# Patient Record
Sex: Female | Born: 1985 | Race: White | Hispanic: No | Marital: Married | Smoking: Former smoker
Health system: Southern US, Community
[De-identification: ages and names within clinical notes are randomized; demographics above are authoritative.]

## PROBLEM LIST (undated history)

## (undated) DIAGNOSIS — M722 Plantar fascial fibromatosis: Secondary | ICD-10-CM

## (undated) DIAGNOSIS — F909 Attention-deficit hyperactivity disorder, unspecified type: Secondary | ICD-10-CM

## (undated) DIAGNOSIS — Z87440 Personal history of urinary (tract) infections: Secondary | ICD-10-CM

## (undated) HISTORY — PX: PLANTAR FASCIA RELEASE: SHX2239

## (undated) HISTORY — DX: Personal history of urinary (tract) infections: Z87.440

## (undated) HISTORY — DX: Attention-deficit hyperactivity disorder, unspecified type: F90.9

---
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
## 2007-07-23 ENCOUNTER — Emergency Department: Payer: Self-pay | Admitting: Emergency Medicine

## 2008-02-09 ENCOUNTER — Emergency Department: Payer: Self-pay | Admitting: Emergency Medicine

## 2008-05-16 ENCOUNTER — Ambulatory Visit: Payer: Self-pay | Admitting: Podiatry

## 2008-09-09 ENCOUNTER — Emergency Department: Payer: Self-pay | Admitting: Emergency Medicine

## 2008-10-17 ENCOUNTER — Emergency Department: Payer: Self-pay | Admitting: Emergency Medicine

## 2008-11-05 ENCOUNTER — Observation Stay: Payer: Self-pay | Admitting: Obstetrics & Gynecology

## 2008-12-27 ENCOUNTER — Emergency Department: Payer: Self-pay | Admitting: Emergency Medicine

## 2008-12-27 ENCOUNTER — Observation Stay: Payer: Self-pay | Admitting: Obstetrics and Gynecology

## 2008-12-28 ENCOUNTER — Emergency Department: Payer: Self-pay

## 2009-01-28 ENCOUNTER — Observation Stay: Payer: Self-pay

## 2009-02-02 ENCOUNTER — Observation Stay: Payer: Self-pay | Admitting: Obstetrics & Gynecology

## 2009-02-18 ENCOUNTER — Inpatient Hospital Stay: Payer: Self-pay

## 2009-04-24 ENCOUNTER — Emergency Department: Payer: Self-pay | Admitting: Emergency Medicine

## 2009-04-26 ENCOUNTER — Emergency Department: Payer: Self-pay | Admitting: Internal Medicine

## 2009-04-28 ENCOUNTER — Emergency Department: Payer: Self-pay | Admitting: Unknown Physician Specialty

## 2009-05-20 ENCOUNTER — Emergency Department: Payer: Self-pay | Admitting: Emergency Medicine

## 2009-11-08 ENCOUNTER — Emergency Department: Payer: Self-pay | Admitting: Emergency Medicine

## 2010-07-19 ENCOUNTER — Emergency Department: Payer: Self-pay | Admitting: Emergency Medicine

## 2011-06-21 ENCOUNTER — Emergency Department: Payer: Self-pay | Admitting: Emergency Medicine

## 2011-06-21 LAB — URINALYSIS, COMPLETE
Glucose,UR: NEGATIVE mg/dL (ref 0–75)
Ketone: NEGATIVE
Leukocyte Esterase: NEGATIVE
Protein: NEGATIVE
Specific Gravity: 1.023 (ref 1.003–1.030)
Squamous Epithelial: 5
WBC UR: 4 /HPF (ref 0–5)

## 2011-07-24 ENCOUNTER — Emergency Department: Payer: Self-pay | Admitting: Emergency Medicine

## 2013-03-12 IMAGING — US US PELV - US TRANSVAGINAL
1 series · 17 of 25 positions shown · non-contrast
Comparison: none

REASON FOR EXAM: lower pelvic pain and  bleeding clots
COMMENTS:

PROCEDURE:     US  - US PELVIS EXAM W/TRANSVAGINAL  - July 19, 2010 [DATE]
RESULT:     Comparison: None
INDICATION: Pelvic pain. LMP 07/14/2010.
TECHNIQUE: Multiple transabdominal gray-scale images and endovaginal
gray-scale images with doppler images of the pelvis performed.

[Series 1: us pelv - us transvaginal · 17 of 68 slices shown]
[im 1/68]
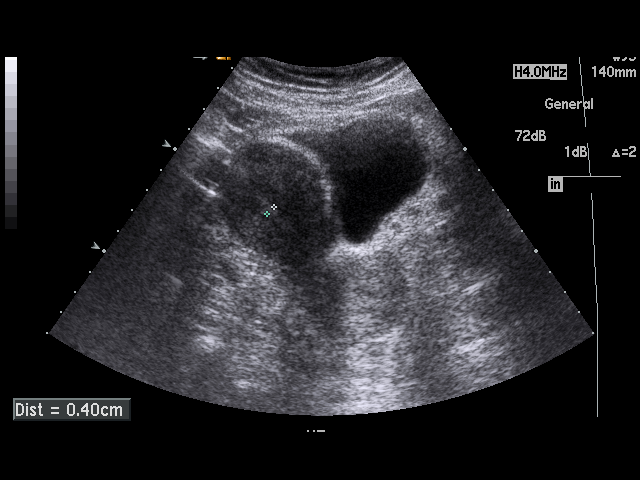
[im 6/68]
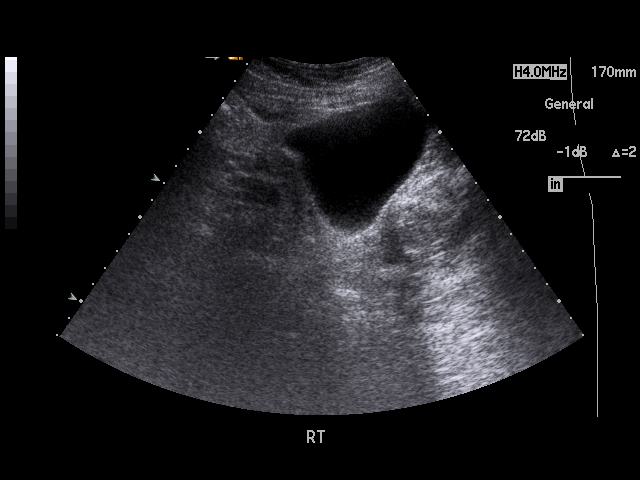
[im 9/68]
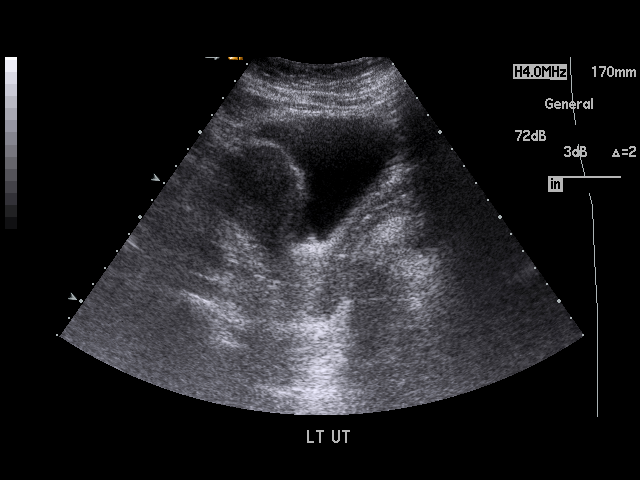
[im 14/68]
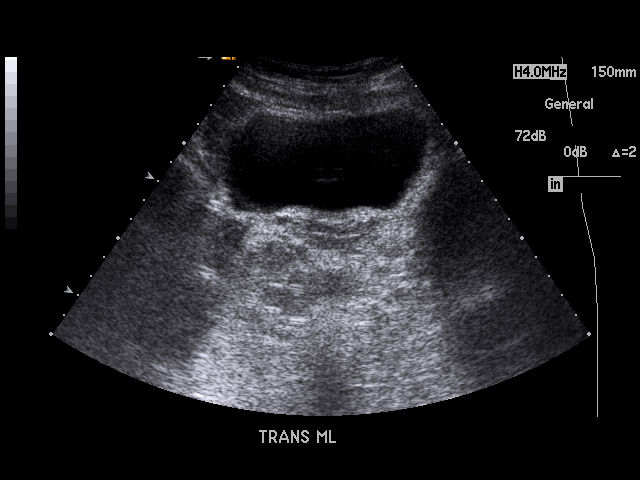
[im 17/68]
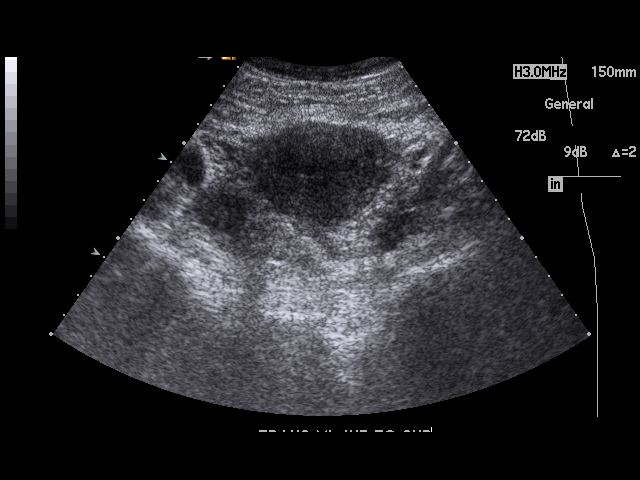
[im 23/68]
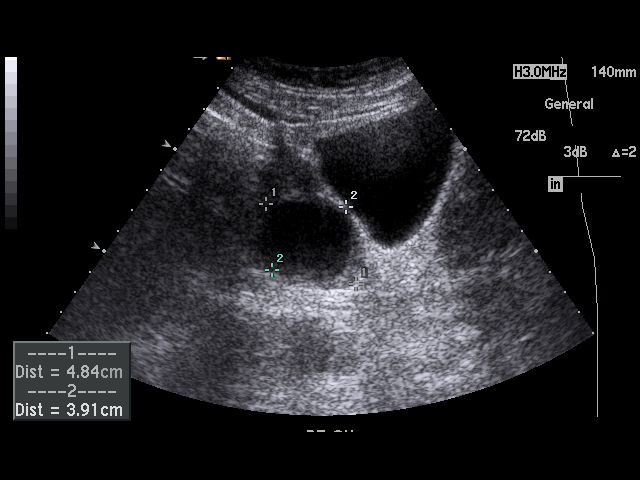
[im 26/68]
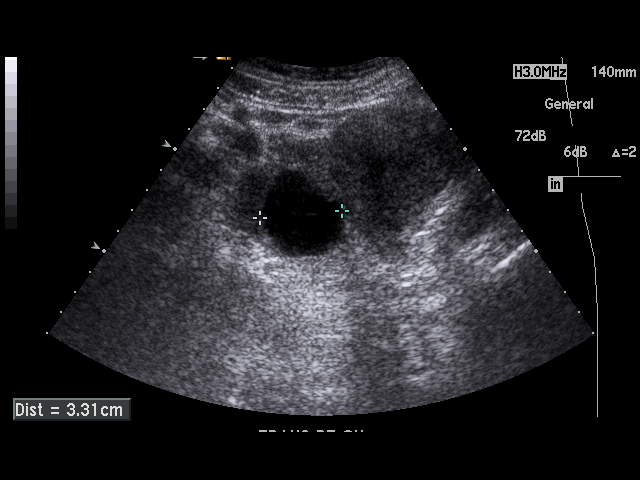
[im 31/68]
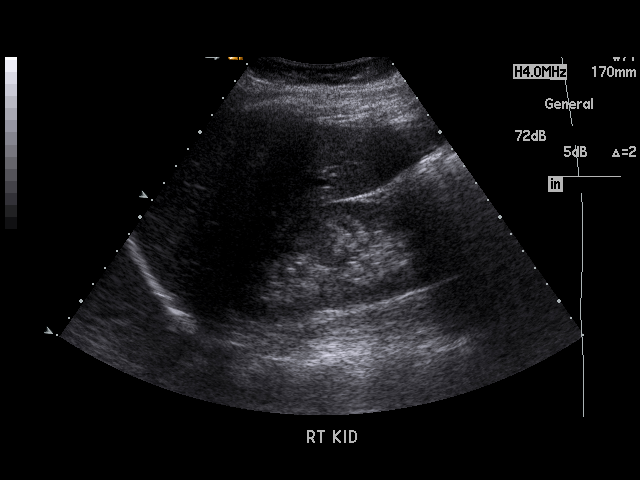
[im 34/68]
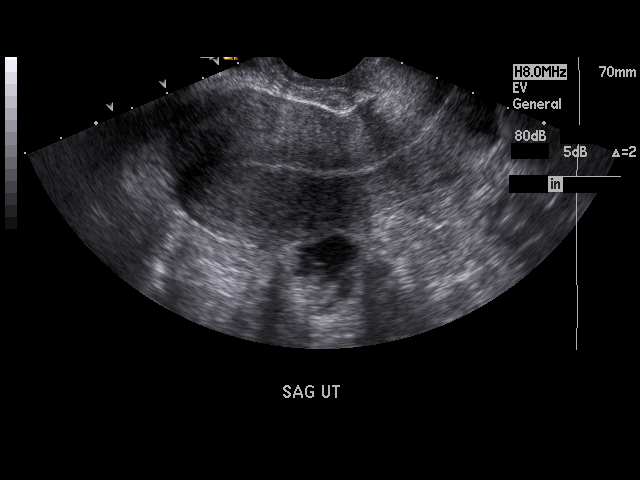
[im 37/68]
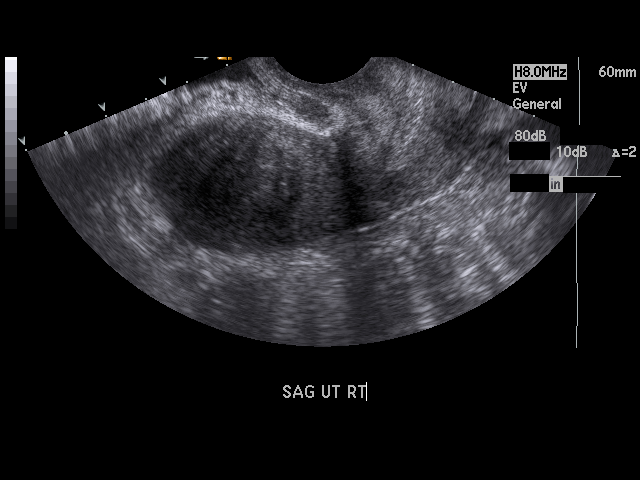
[im 42/68]
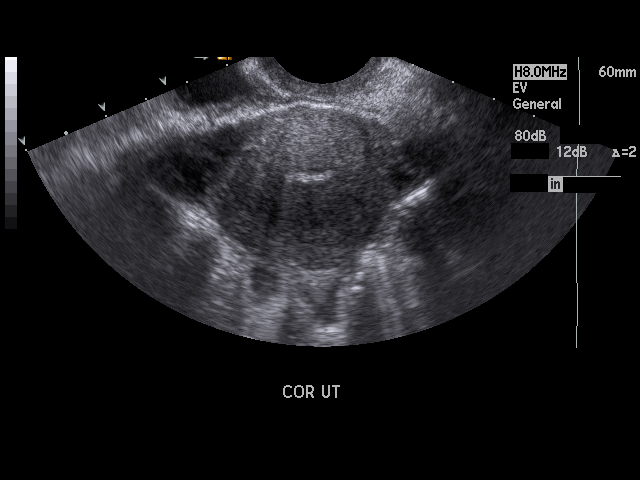
[im 45/68]
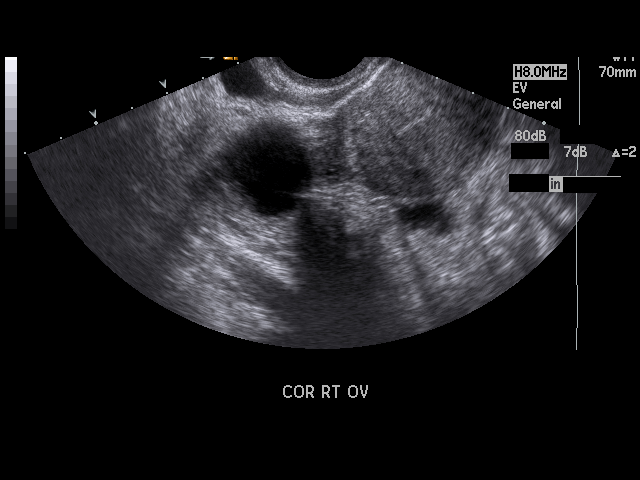
[im 51/68]
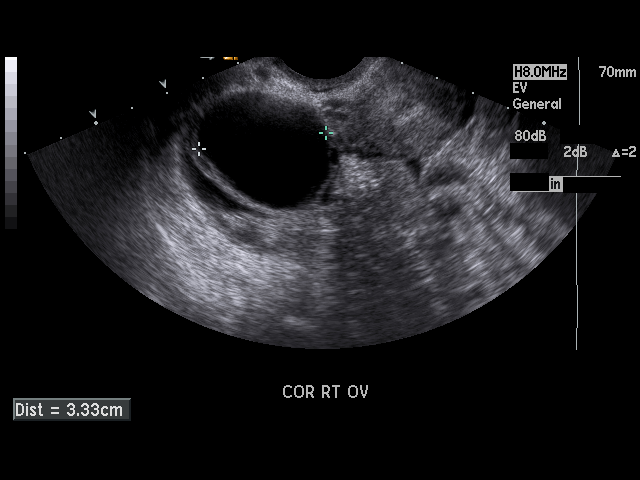
[im 54/68]
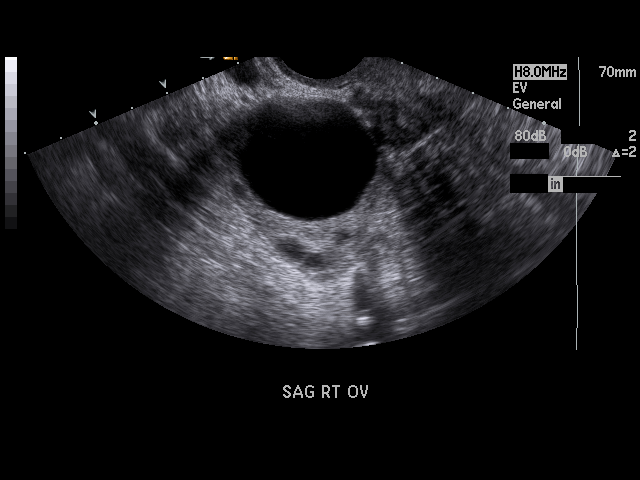
[im 59/68]
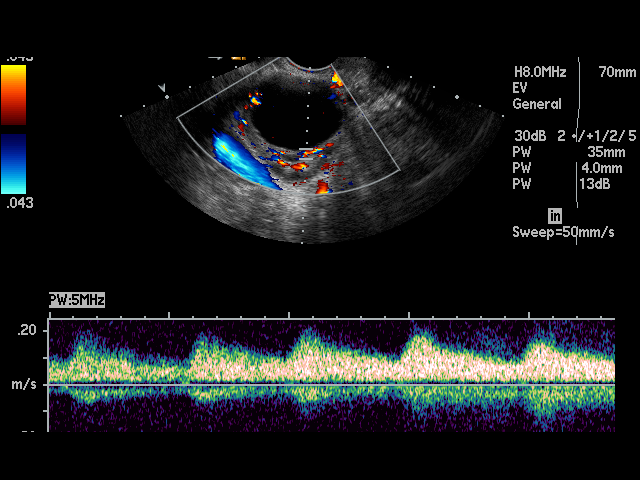
[im 62/68]
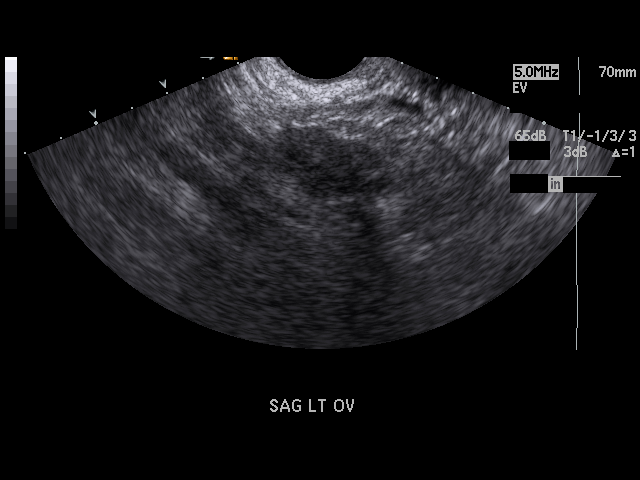
[im 68/68]
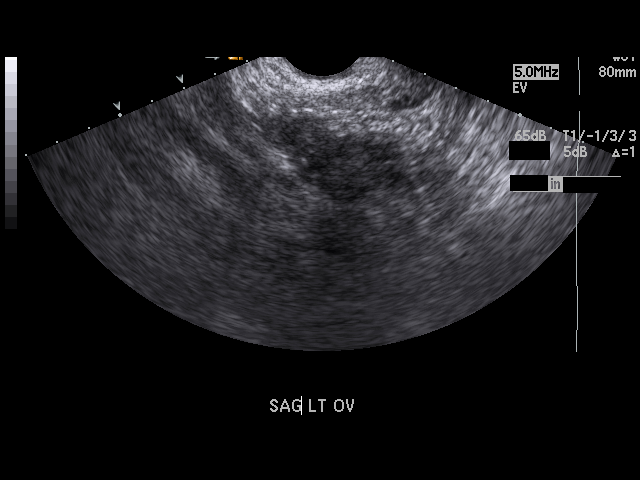

[17 of 25 positions shown; findings below may reference images not displayed]

FINDINGS: The uterus is normal in echotexture measuring 9.6 x 5.8 x 4.3 cm , with
transabdominal ultrasound. The endometrial stripe is uniform and homogeneous
measuring 2.9 mm.  There are no abnormal solid or cystic myometrial mass
lesions noted.

The right ovary measures 5 x 4.1 x 4 cm.  The left ovary measures 3.8 x
x 2.3 cm.  Normal arterial and venous Doppler waveforms are demonstrated
bilaterally.  There is a 3.7 x 3.3 x 3.2 cm anechoic right ovarian mass with
increased through transmission most consistent with a cyst.

There is no pelvic free fluid.
IMPRESSION: Right ovarian cyst. Recommend followup pelvic ultrasound in 4 to 6 weeks.

Otherwise normal pelvic ultrasound.

## 2014-11-22 ENCOUNTER — Emergency Department
Admission: EM | Admit: 2014-11-22 | Discharge: 2014-11-22 | Disposition: A | Discharge status: Home / Self Care | Payer: BLUE CROSS/BLUE SHIELD | Attending: Emergency Medicine | Admitting: Emergency Medicine

## 2014-11-22 DIAGNOSIS — Z3202 Encounter for pregnancy test, result negative: Secondary | ICD-10-CM | POA: Insufficient documentation

## 2014-11-22 DIAGNOSIS — K625 Hemorrhage of anus and rectum: Secondary | ICD-10-CM

## 2014-11-22 DIAGNOSIS — K5900 Constipation, unspecified: Secondary | ICD-10-CM | POA: Diagnosis not present

## 2014-11-22 DIAGNOSIS — Z720 Tobacco use: Secondary | ICD-10-CM | POA: Insufficient documentation

## 2014-11-22 HISTORY — DX: Plantar fascial fibromatosis: M72.2

## 2014-11-22 LAB — URINALYSIS COMPLETE WITH MICROSCOPIC (ARMC ONLY)
Bilirubin Urine: NEGATIVE
Glucose, UA: NEGATIVE mg/dL
HGB URINE DIPSTICK: NEGATIVE
Ketones, ur: NEGATIVE mg/dL
LEUKOCYTES UA: NEGATIVE
Nitrite: NEGATIVE
PROTEIN: NEGATIVE mg/dL
SPECIFIC GRAVITY, URINE: 1.021 (ref 1.005–1.030)
pH: 7 (ref 5.0–8.0)

## 2014-11-22 LAB — COMPREHENSIVE METABOLIC PANEL
ALBUMIN: 4 g/dL (ref 3.5–5.0)
ALT: 16 U/L (ref 14–54)
AST: 19 U/L (ref 15–41)
Alkaline Phosphatase: 63 U/L (ref 38–126)
Anion gap: 5 (ref 5–15)
BUN: 16 mg/dL (ref 6–20)
CHLORIDE: 107 mmol/L (ref 101–111)
CO2: 25 mmol/L (ref 22–32)
Calcium: 9.3 mg/dL (ref 8.9–10.3)
Creatinine, Ser: 0.68 mg/dL (ref 0.44–1.00)
GFR calc Af Amer: >60 mL/min (ref >60)
Glucose, Bld: 87 mg/dL (ref 65–99)
POTASSIUM: 3.8 mmol/L (ref 3.5–5.1)
SODIUM: 137 mmol/L (ref 135–145)
Total Bilirubin: 0.6 mg/dL (ref 0.3–1.2)
Total Protein: 7.1 g/dL (ref 6.5–8.1)

## 2014-11-22 LAB — POCT PREGNANCY, URINE: PREG TEST UR: NEGATIVE

## 2014-11-22 LAB — CBC
HEMATOCRIT: 39.9 % (ref 35.0–47.0)
Hemoglobin: 13.9 g/dL (ref 12.0–16.0)
MCH: 32 pg (ref 26.0–34.0)
MCHC: 34.9 g/dL (ref 32.0–36.0)
MCV: 91.9 fL (ref 80.0–100.0)
Platelets: 299 10*3/uL (ref 150–440)
RBC: 4.34 MIL/uL (ref 3.80–5.20)
RDW: 12.5 % (ref 11.5–14.5)
WBC: 9.3 10*3/uL (ref 3.6–11.0)

## 2014-11-22 LAB — LIPASE, BLOOD: LIPASE: 22 U/L (ref 11–51)

## 2014-11-22 NOTE — ED Notes (Signed)
Mid to lower abd cramping with some bright red blood in stools   Denies any n/v

## 2014-11-22 NOTE — ED Provider Notes (Signed)
[facility] Emergency Department Provider Note  ____________________________________________  Time seen: Approximately 4:30 PM  I have reviewed the triage vital signs and the nursing notes.   HISTORY  Chief Complaint Rectal Bleeding  HPI Wendy Warren is a 29 y.o. female is here with complaint of seeing bright red blood in her stool 3 days ago and then again today. Patient states that she denies having any problems with hemorrhoids in the past. She states that she did pass small hard stool 3 days ago when she first saw some bright red blood. She denies any dizziness, weakness, fever or chills. Currently she denies any abdominal pain or nausea vomiting. She denies any history of peptic ulcer disease. She does rate her discomfort as 7 out of 10.   Past Medical History  Diagnosis Date  . Plantar fasciitis     There are no active problems to display for this patient.   Past Surgical History  Procedure Laterality Date  . Plantar fascia release      No current outpatient prescriptions on file.  Allergies Morphine and related  No family history on file.  Social History Social History  Substance Use Topics  . Smoking status: Current Every Day Smoker  . Smokeless tobacco: None  . Alcohol Use: No    Review of Systems Constitutional: No fever/chills Eyes: No visual changes. Cardiovascular: Denies chest pain. Respiratory: Denies shortness of breath. Gastrointestinal: Mild abdominal pain.  No nausea, no vomiting.  No diarrhea.  Positive constipation. Musculoskeletal: Negative for back pain. Skin: Negative for rash. Neurological: Negative for headaches, focal weakness or numbness.  10-point ROS otherwise negative.  ____________________________________________   PHYSICAL EXAM:  VITAL SIGNS: ED Triage Vitals  Enc Vitals Group     BP 11/22/14 1223 130/89 mmHg     Pulse Rate 11/22/14 1223 71     Resp 11/22/14 1223 16     Temp 11/22/14 1223  97.7 F (36.5 C)     Temp src --      SpO2 11/22/14 1223 100 %     Weight 11/22/14 1223 165 lb (74.844 kg)     Height 11/22/14 1223  (1.575 m)     Head Cir --      Peak Flow --      Pain Score 11/22/14 1223 7     Pain Loc --      Pain Edu? --      Excl. in GC? --     Constitutional: Alert and oriented. Well appearing and in no acute distress. Eyes: Conjunctivae are normal. PERRL. EOMI. Head: Atraumatic. Nose: No congestion/rhinnorhea. Neck: No stridor.   Cardiovascular: Normal rate, regular rhythm. Grossly normal heart sounds.  Good peripheral circulation. Respiratory: Normal respiratory effort.  No retractions. Lungs CTAB. Gastrointestinal: Soft and nontender. No distention. Bowel sounds 4 quadrants normal activity Rectal exam did not show any external hemorrhoids. No blood was seen. Digital exam no masses were noted. No stool was present in the vault. Hemoccult slide was positive Musculoskeletal: No lower extremity tenderness nor edema.  No joint effusions. Neurologic:  Normal speech and language. No gross focal neurologic deficits are appreciated. No gait instability. Skin:  Skin is warm, dry and intact. No rash noted. Psychiatric: Mood and affect are normal. Speech and behavior are normal.  ____________________________________________   LABS (all labs ordered are listed, but only abnormal results are displayed)  Labs Reviewed  URINALYSIS COMPLETEWITH MICROSCOPIC ([facility] ONLY) - Abnormal; Notable for the following:  Color, Urine YELLOW (*)    APPearance TURBID (*)    Bacteria, UA RARE (*)    Squamous Epithelial / LPF 6-30 (*)    All other components within normal limits  LIPASE, BLOOD  COMPREHENSIVE METABOLIC PANEL  CBC  POC URINE PREG, ED  POCT PREGNANCY, URINE    PROCEDURES  Procedure(s) performed: None  Critical Care performed: No  ____________________________________________   INITIAL IMPRESSION / ASSESSMENT AND PLAN / ED COURSE  Pertinent labs  & imaging results that were available during my care of the patient were reviewed by me and considered in my medical decision making (see chart for details).  Patient's lab work was reviewed and is stable. Patient is not actively bleeding at this time and history reports that this is been intermittent. The patient was encouraged to make an appointment with Dr. Bluford Kaufmannh for further evaluation. She is return to the emergency room if any severe worsening of her symptoms or tarry stools. ____________________________________________   FINAL CLINICAL IMPRESSION(S) / ED DIAGNOSES  Final diagnoses:  Rectal bleeding      Tommi RumpsRhonda L Alen Matheson, PA-C 11/22/14 1703  Emily FilbertJonathan E Williams, MD 11/22/14 2251

## 2014-11-22 NOTE — ED Notes (Signed)
Pt c/o bright red blood with BM intermittent for the past 3 days, only passing small hard stools.Marland Kitchen.denies rectal pain,, pt c/o mid abd cramping and nausea..Marland Kitchen

## 2014-11-22 NOTE — Discharge Instructions (Signed)
YOU WILL NEED TO MAKE AN APPOINTMENT WITH DR. Bluford KaufmannH FOR FURTHER EVALUATION If there are any problems with hard stools or constipation began taking Colace for stool softening. Increase fluid intake. Currently all of your lab work is within normal limits. Return to the emergency room if any tarry black stools are seen.

## 2017-03-21 ENCOUNTER — Other Ambulatory Visit: Payer: Self-pay

## 2017-03-21 ENCOUNTER — Encounter: Payer: Self-pay | Admitting: Emergency Medicine

## 2017-03-21 ENCOUNTER — Emergency Department
Admission: EM | Admit: 2017-03-21 | Discharge: 2017-03-21 | Disposition: A | Discharge status: Home / Self Care | Payer: BLUE CROSS/BLUE SHIELD | Attending: Emergency Medicine | Admitting: Emergency Medicine

## 2017-03-21 DIAGNOSIS — J101 Influenza due to other identified influenza virus with other respiratory manifestations: Secondary | ICD-10-CM | POA: Insufficient documentation

## 2017-03-21 DIAGNOSIS — F17210 Nicotine dependence, cigarettes, uncomplicated: Secondary | ICD-10-CM | POA: Insufficient documentation

## 2017-03-21 LAB — INFLUENZA PANEL BY PCR (TYPE A & B)
INFLAPCR: POSITIVE — AB
Influenza B By PCR: NEGATIVE

## 2017-03-21 MED ORDER — OSELTAMIVIR PHOSPHATE 75 MG PO CAPS: 75.0000 mg | ORAL_CAPSULE | Freq: Two times a day (BID) | ORAL | 0 refills | Status: DC

## 2017-03-21 MED ORDER — ONDANSETRON 4 MG PO TBDP
4.0000 mg | ORAL_TABLET | Freq: Three times a day (TID) | ORAL | 0 refills | Status: DC | PRN
Start: 2017-03-21 — End: 2018-09-07

## 2017-03-21 NOTE — ED Triage Notes (Signed)
Cough and body aches since yesterday.

## 2017-03-21 NOTE — ED Provider Notes (Signed)
[facility] Emergency Department Provider Note  ____________________________________________  Time seen: Approximately 4:38 PM  I have reviewed the triage vital signs and the nursing notes.   HISTORY  Chief Complaint Cough and Generalized Body Aches    HPI Wendy Warren is a 32 y.o. female who presents emergency department complaining of fevers and chills, nasal congestion, sore throat, cough, body aches, nausea times 2 days.  Symptoms began rather rapidly.  Patient reports that she does not have an appetite.  She has any headache, visual changes, neck pain or stiffness, chest pain, shortness of breath, emesis, diarrhea or constipation.  Patient has been taking Tylenol, Motrin, DayQuil, NyQuil for symptoms.  This does not improve her symptoms greatly.  Patient is staying hydrated with fluids at home.  No other complaints.  No other medications prior to arrival.  Past Medical History:  Diagnosis Date  . Plantar fasciitis     There are no active problems to display for this patient.   Past Surgical History:  Procedure Laterality Date  . PLANTAR FASCIA RELEASE      Prior to Admission medications   Medication Sig Start Date End Date Taking? Authorizing Provider  ondansetron (ZOFRAN-ODT) 4 MG disintegrating tablet Take 1 tablet (4 mg total) by mouth every 8 (eight) hours as needed for nausea or vomiting. 03/21/17   Nehemyah Foushee, Delorise RoyalsJonathan D, PA-C  oseltamivir (TAMIFLU) 75 MG capsule Take 1 capsule (75 mg total) by mouth 2 (two) times daily. 03/21/17   Electa Sterry, Delorise RoyalsJonathan D, PA-C    Allergies Morphine and related  No family history on file.  Social History Social History   Tobacco Use  . Smoking status: Current Every Day Smoker    Packs/day: 0.50    Types: Cigarettes  Substance Use Topics  . Alcohol use: No  . Drug use: No     Review of Systems  Constitutional: Positive fever/chills.  Positive for generalized body aches Eyes: No visual changes. No  discharge ENT: Other for nasal congestion and sore throat. Cardiovascular: no chest pain. Respiratory: Positive cough. No SOB. Gastrointestinal: No abdominal pain.  Positive nausea, no vomiting.  No diarrhea.  No constipation. Musculoskeletal: Negative for musculoskeletal pain. Skin: Negative for rash, abrasions, lacerations, ecchymosis. Neurological: Negative for headaches, focal weakness or numbness. 10-point ROS otherwise negative.  ____________________________________________   PHYSICAL EXAM:  VITAL SIGNS: ED Triage Vitals  Enc Vitals Group     BP 03/21/17 1414 124/68     Pulse Rate 03/21/17 1414 89     Resp 03/21/17 1414 20     Temp 03/21/17 1414 99.3 F (37.4 C)     Temp Source 03/21/17 1414 Oral     SpO2 03/21/17 1414 97 %     Weight 03/21/17 1415 150 lb (68 kg)     Height 03/21/17 1415 5\' 2"  (1.575 m)     Head Circumference --      Peak Flow --      Pain Score 03/21/17 1414 8     Pain Loc --      Pain Edu? --      Excl. in GC? --      Constitutional: Alert and oriented. Well appearing and in no acute distress. Eyes: Conjunctivae are normal. PERRL. EOMI. Head: Atraumatic. ENT:      Ears: EACs and TMs unremarkable bilaterally.      Nose: Moderate clear congestion/rhinnorhea.      Mouth/Throat: Mucous membranes are moist.  Oropharynx is mildly erythematous but nonedematous.  Tonsils  are unremarkable.  Uvula is midline. Neck: No stridor.  Neck is supple full range of motion Hematological/Lymphatic/Immunilogical: Diffuse, mobile, nontender anterior cervical lymphadenopathy. Cardiovascular: Normal rate, regular rhythm. Normal S1 and S2.  Good peripheral circulation. Respiratory: Normal respiratory effort without tachypnea or retractions. Lungs CTAB. Good air entry to the bases with no decreased or absent breath sounds. Gastrointestinal: Bowel sounds 4 quadrants. Soft and nontender to palpation. No guarding or rigidity. No palpable masses. No distention. No CVA  tenderness. Musculoskeletal: Full range of motion to all extremities. No gross deformities appreciated. Neurologic:  Normal speech and language. No gross focal neurologic deficits are appreciated.  Skin:  Skin is warm, dry and intact. No rash noted. Psychiatric: Mood and affect are normal. Speech and behavior are normal. Patient exhibits appropriate insight and judgement.   ____________________________________________   LABS (all labs ordered are listed, but only abnormal results are displayed)  Labs Reviewed  INFLUENZA PANEL BY PCR (TYPE A & B) - Abnormal; Notable for the following components:      Result Value   Influenza A By PCR POSITIVE (*)    All other components within normal limits   ____________________________________________  EKG   ____________________________________________  RADIOLOGY   No results found.  ____________________________________________    PROCEDURES  Procedure(s) performed:    Procedures    Medications - No data to display   ____________________________________________   INITIAL IMPRESSION / ASSESSMENT AND PLAN / ED COURSE  Pertinent labs & imaging results that were available during my care of the patient were reviewed by me and considered in my medical decision making (see chart for details).  Review of the [place] CSRS was performed in accordance of the [facility] prior to dispensing any controlled drugs.     Patient's diagnosis is consistent with influenza A.  Patient presented with flulike symptoms.  Differential included viral URI, strep, sinusitis, gastroenteritis, influenza.  Patient was positive on testing for influenza A.. Patient will be discharged home with prescriptions for Tamiflu and Zofran. Patient is to follow up with primary care as needed or otherwise directed. Patient is given ED precautions to return to the ED for any worsening or new symptoms.     ____________________________________________  FINAL CLINICAL  IMPRESSION(S) / ED DIAGNOSES  Final diagnoses:  Influenza A      NEW MEDICATIONS STARTED DURING THIS VISIT:  ED Discharge Orders        Ordered    oseltamivir (TAMIFLU) 75 MG capsule  2 times daily     03/21/17 1638    ondansetron (ZOFRAN-ODT) 4 MG disintegrating tablet  Every 8 hours PRN     03/21/17 1638          This chart was dictated using voice recognition software/Dragon. Despite best efforts to proofread, errors can occur which can change the meaning. Any change was purely unintentional.    Racheal Patches, PA-C 03/21/17 1645    Pershing Proud Myra Rude, MD 03/21/17 [phone]

## 2017-03-21 NOTE — ED Notes (Signed)
Pt reports being achy.  Pt states "I hate the fucking hospital, no one does anything for you here".  Pt was asleep in waiting room and was wheeled back to room by friend.  Pt repetitively cursing at this RN and friend with her.

## 2018-08-21 NOTE — Progress Notes (Signed)
Chart abstraction per phone interview with Tawanna Solo, RN 08/21/18 Debera Lat, RN

## 2018-08-22 ENCOUNTER — Other Ambulatory Visit: Payer: Self-pay

## 2018-08-22 ENCOUNTER — Ambulatory Visit: Payer: Medicaid Other | Admitting: Nurse Practitioner

## 2018-08-22 ENCOUNTER — Other Ambulatory Visit: Payer: Self-pay | Admitting: Family Medicine

## 2018-08-22 ENCOUNTER — Encounter: Payer: Self-pay | Admitting: Nurse Practitioner

## 2018-08-22 DIAGNOSIS — F909 Attention-deficit hyperactivity disorder, unspecified type: Secondary | ICD-10-CM | POA: Diagnosis not present

## 2018-08-22 DIAGNOSIS — Z3480 Encounter for supervision of other normal pregnancy, unspecified trimester: Secondary | ICD-10-CM

## 2018-08-22 LAB — URINALYSIS
Bilirubin, UA: NEGATIVE
Glucose, UA: NEGATIVE
Ketones, UA: NEGATIVE
Leukocytes,UA: NEGATIVE
Nitrite, UA: NEGATIVE
Protein,UA: NEGATIVE
RBC, UA: NEGATIVE
Specific Gravity, UA: 1.03 (ref 1.005–1.030)
Urobilinogen, Ur: 0.2 mg/dL (ref 0.2–1.0)
pH, UA: 7.5 (ref 5.0–7.5)

## 2018-08-22 LAB — WET PREP FOR TRICH, YEAST, CLUE
Trichomonas Exam: NEGATIVE
Yeast Exam: NEGATIVE

## 2018-08-22 LAB — HEMOGLOBIN, FINGERSTICK: Hemoglobin: 13.2 g/dL (ref 11.1–15.9)

## 2018-08-22 LAB — OB RESULTS CONSOLE HIV ANTIBODY (ROUTINE TESTING): HIV: NONREACTIVE

## 2018-08-22 NOTE — Progress Notes (Signed)
[place] DEPT [facility] [place] [id number] [phone]  INITIAL PRENATAL VISIT NOTE  Subjective:  Wendy Warren is a 33 y.o. G3P2002 at [redacted]w[redacted]d being seen today to start prenatal care at the [facility].  She is currently monitored for the following issues for this high-risk pregnancy and has ADHD and Supervision of other normal pregnancy, antepartum on their problem list.  Patient reports no complaints.  Contractions: Not present. Vag. Bleeding: None.  Movement: Absent. Denies leaking of fluid.   The following portions of the patient's history were reviewed and updated as appropriate: allergies, current medications, past family history, past medical history, past social history, past surgical history and problem list. Problem list updated.  Objective:   Vitals:   08/22/18 0837  BP: 117/72  Temp: 98 F (36.7 C)  Weight: 168 lb 3.2 oz (76.3 kg)    Fetal Status:     Movement: Absent     Physical Exam Vitals signs and nursing note reviewed.  Constitutional:      General: She is not in acute distress.    Appearance: Normal appearance. She is well-developed.  HENT:     Head: Normocephalic and atraumatic.     Mouth/Throat:     Lips: Pink.     Mouth: Mucous membranes are moist.     Dentition: Normal dentition. No dental caries.  Neck:     Thyroid: No thyroid mass or thyromegaly.  Cardiovascular:     Rate and Rhythm: Normal rate.  Pulmonary:     Effort: Pulmonary effort is normal.     Breath sounds: Normal breath sounds.  Chest:     Breasts: Breasts are symmetrical.        Right: Normal. No mass, nipple discharge or skin change.        Left: Normal. No mass, nipple discharge or skin change.  Abdominal:     General: Abdomen is flat.     Palpations: Abdomen is soft.     Tenderness: There is no abdominal tenderness.     Comments: Gravid   Genitourinary:    General: Normal  vulva.     Exam position: Lithotomy position.     Pubic Area: No rash.      Labia:        Right: No rash.        Left: No rash.      Vagina: Normal. No vaginal discharge.     Cervix: No cervical motion tenderness or friability.     Uterus: Normal. Enlarged (G 3 P 2 -  ~ [redacted] wks gestation size - below SP). Not tender.      Adnexa: Right adnexa normal and left adnexa normal.     Rectum: Normal. No external hemorrhoid.  Lymphadenopathy:     Upper Body:     Right upper body: No axillary adenopathy.     Left upper body: No axillary adenopathy.  Skin:    General: Skin is warm.     Capillary Refill: Capillary refill takes less than 2 seconds.  Neurological:     Mental Status: She is alert.     Assessment and Plan:  Pregnancy: G3P2002 at [redacted]w[redacted]d  1. Attention deficit hyperactivity disorder (ADHD), unspecified ADHD type Discussed Adderall medication  - plan to consult with Dr. Ernestina Patches concerning continuation of medication during pregnancy  2. Supervision of other normal pregnancy, antepartum Doing well      Discussed overview of care  and coordination with [facility].   Reviewed Centering pregnancy as standard of care at [facility], oriented to room and showed video. Based on EDD, plan for Cycle  - discussed Zoom pregnancy.    Preterm labor symptoms and general obstetric precautions including but not limited to vaginal bleeding, contractions, leaking of fluid and fetal movement were reviewed in detail with the patient.  Please refer to After Visit Summary for other counseling recommendations.   Return in about 4 weeks (around 09/19/2018) for telehealth.  Future Appointments  Date Time Provider Department Center  09/19/2018  8:20 AM [facility] PROVIDER [facility] None    Donn PieriniKarla W Kaulana Brindle, NP

## 2018-08-22 NOTE — Patient Instructions (Signed)

## 2018-08-22 NOTE — Progress Notes (Signed)
Patient in for new OB appt. Patient husband accompanying. Early 1 hr. Gtt today with other labs.Jenetta Downer, RN

## 2018-08-23 ENCOUNTER — Other Ambulatory Visit: Payer: Self-pay | Admitting: Nurse Practitioner

## 2018-08-23 DIAGNOSIS — Z369 Encounter for antenatal screening, unspecified: Secondary | ICD-10-CM

## 2018-08-24 ENCOUNTER — Telehealth: Payer: Self-pay

## 2018-08-24 ENCOUNTER — Ambulatory Visit: Payer: Self-pay

## 2018-08-24 LAB — CBC/D/PLT+RPR+RH+ABO+RUB AB...
Antibody Screen: NEGATIVE
Basophils Absolute: 0 10*3/uL (ref 0.0–0.2)
Basos: 0 %
EOS (ABSOLUTE): 0.2 10*3/uL (ref 0.0–0.4)
Eos: 2 %
Hematocrit: 39.5 % (ref 34.0–46.6)
Hemoglobin: 13.6 g/dL (ref 11.1–15.9)
Hepatitis B Surface Ag: NEGATIVE
Immature Grans (Abs): 0 10*3/uL (ref 0.0–0.1)
Immature Granulocytes: 0 %
Lymphocytes Absolute: 1.8 10*3/uL (ref 0.7–3.1)
Lymphs: 22 %
MCH: 32.4 pg (ref 26.6–33.0)
MCHC: 34.4 g/dL (ref 31.5–35.7)
MCV: 94 fL (ref 79–97)
Monocytes Absolute: 0.5 10*3/uL (ref 0.1–0.9)
Monocytes: 6 %
Neutrophils Absolute: 5.9 10*3/uL (ref 1.4–7.0)
Neutrophils: 70 %
Platelets: 268 10*3/uL (ref 150–450)
RBC: 4.2 x10E6/uL (ref 3.77–5.28)
RDW: 12 % (ref 11.7–15.4)
RPR Ser Ql: NONREACTIVE
Rh Factor: POSITIVE
Rubella Antibodies, IGG: 1.68 index (ref >0.99)
Varicella zoster IgG: 514 index (ref >165)
WBC: 8.5 10*3/uL (ref 3.4–10.8)

## 2018-08-24 LAB — GLUCOSE, 1 HOUR GESTATIONAL: Gestational Diabetes Screen: 76 mg/dL (ref 65–139)

## 2018-08-24 LAB — COMPREHENSIVE METABOLIC PANEL
ALT: 9 IU/L (ref 0–32)
AST: 15 IU/L (ref 0–40)
Albumin/Globulin Ratio: 1.6 (ref 1.2–2.2)
Albumin: 3.9 g/dL (ref 3.8–4.8)
Alkaline Phosphatase: 59 IU/L (ref 39–117)
BUN/Creatinine Ratio: 13 (ref 9–23)
BUN: 7 mg/dL (ref 6–20)
Bilirubin Total: <0.2 mg/dL (ref 0.0–1.2)
CO2: 23 mmol/L (ref 20–29)
Calcium: 9.1 mg/dL (ref 8.7–10.2)
Chloride: 102 mmol/L (ref 96–106)
Creatinine, Ser: 0.56 mg/dL — ABNORMAL LOW (ref 0.57–1.00)
GFR calc Af Amer: 144 mL/min/{1.73_m2} (ref >59)
GFR calc non Af Amer: 125 mL/min/{1.73_m2} (ref >59)
Globulin, Total: 2.4 g/dL (ref 1.5–4.5)
Glucose: 83 mg/dL (ref 65–99)
Potassium: 4.3 mmol/L (ref 3.5–5.2)
Sodium: 139 mmol/L (ref 134–144)
Total Protein: 6.3 g/dL (ref 6.0–8.5)

## 2018-08-24 LAB — HGB A1C W/O EAG: Hgb A1c MFr Bld: 4.6 % — ABNORMAL LOW (ref 4.8–5.6)

## 2018-08-24 LAB — TSH: TSH: 1.32 u[IU]/mL (ref 0.450–4.500)

## 2018-08-24 LAB — URINE CULTURE

## 2018-08-24 LAB — LEAD, BLOOD (ADULT >= 16 YRS): Lead-Whole Blood: NOT DETECTED ug/dL (ref 0–4)

## 2018-08-24 NOTE — Telephone Encounter (Signed)
Phone call to client who states has spoken with [facility] and will only have genetic counseling appt via phone call on Monday 08/28/2018 at 1 pm. 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 Client states [facility] will schedule [place] following phone appt Monday if [place] needed. Client reminded that after EGA of 13 6/7, first trimester [place] unable to be performed. Shona Needles, RN

## 2018-08-24 NOTE — Telephone Encounter (Signed)
Attempted to call client to inform of [facility] 1st screen appt: 08/28/18 @ 12:45; no answer & no voicemail Debera Lat, RN

## 2018-08-27 LAB — 789231 7+OXYCODONE-BUND
Amphetamines, Urine: NEGATIVE ng/mL
BENZODIAZ UR QL: NEGATIVE ng/mL
Barbiturate screen, urine: NEGATIVE ng/mL
Cocaine (Metab.): NEGATIVE ng/mL
OPIATE SCREEN URINE: NEGATIVE ng/mL
Oxycodone/Oxymorphone, Urine: NEGATIVE ng/mL
PCP Quant, Ur: NEGATIVE ng/mL

## 2018-08-27 LAB — PROTEIN / CREATININE RATIO, URINE
Creatinine, Urine: 70.3 mg/dL
Protein, Ur: 4.1 mg/dL
Protein/Creat Ratio: 58 mg/g creat (ref 0–200)

## 2018-08-27 LAB — CANNABINOID CONFIRMATION, UR
CANNABINOIDS: POSITIVE — AB
Carboxy THC GC/MS Conf: 166 ng/mL

## 2018-08-28 ENCOUNTER — Ambulatory Visit: Payer: Self-pay

## 2018-08-28 LAB — CHLAMYDIA/GC NAA, CONFIRMATION
Chlamydia trachomatis, NAA: NEGATIVE
Neisseria gonorrhoeae, NAA: NEGATIVE

## 2018-08-29 ENCOUNTER — Encounter: Payer: Self-pay | Admitting: Nurse Practitioner

## 2018-08-29 DIAGNOSIS — Z6710 Type A blood, Rh positive: Secondary | ICD-10-CM | POA: Insufficient documentation

## 2018-08-29 DIAGNOSIS — F1290 Cannabis use, unspecified, uncomplicated: Secondary | ICD-10-CM | POA: Insufficient documentation

## 2018-08-30 LAB — IGP, APTIMA HPV
HPV Aptima: NEGATIVE
PAP Smear Comment: 0

## 2018-08-31 ENCOUNTER — Other Ambulatory Visit: Payer: Self-pay

## 2018-08-31 ENCOUNTER — Ambulatory Visit
Admission: RE | Admit: 2018-08-31 | Discharge: 2018-08-31 | Disposition: A | Discharge status: Home / Self Care | Payer: BLUE CROSS/BLUE SHIELD | Source: Ambulatory Visit | Attending: Obstetrics and Gynecology | Admitting: Obstetrics and Gynecology

## 2018-08-31 DIAGNOSIS — Z818 Family history of other mental and behavioral disorders: Secondary | ICD-10-CM

## 2018-08-31 DIAGNOSIS — Z36 Encounter for antenatal screening for chromosomal anomalies: Secondary | ICD-10-CM

## 2018-08-31 NOTE — Progress Notes (Addendum)
Virtual Visit via Telephone Note  I connected with Wendy Warren on 08/31/2018 at  1:00 PM EDT by telephone and verified that I am speaking with the correct person using two identifiers.  Ms. Wendy Warren was referred to [facility] for genetic counseling to review prenatal screening and testing options.  This note summarizes the information we discussed.    We offered the following routine screening tests for this pregnancy:  The most accurate screening option for chromosome conditions is cell free fetal DNA testing.  Though this is typically reserved for pregnancies at increased risk for aneuploidy, it is currently being made available and many insurance companies are adding coverage for this testing in low risk patients during COVID.  This test utilizes a maternal blood sample and DNA sequencing technology to isolate circulating cell free fetal DNA from maternal plasma.  The fetal DNA can then be analyzed for DNA sequences that are derived from the three most common chromosomes involved in aneuploidy, chromosomes 13, 18, and 21.  If the overall amount of DNA is greater than the expected level for any of these chromosomes, aneuploidy is suspected.  The detection rates are >99% for Down syndrome, >98% for trisomy 18 and >91% for Trisomy 13.  While we do not consider it a replacement for invasive testing and karyotype analysis, a negative result from this testing would be reassuring, though not a guarantee of a normal chromosome complement for the baby.  An abnormal result may be suggestive of an abnormal chromosome complement, though we would still recommend CVS or amniocentesis to confirm any findings from this testing.  First trimester screening, which includes nuchal translucency ultrasound screen and first trimester maternal serum marker screening, is the test that has most recently been available for low risk patients.  The nuchal translucency has approximately an 80% detection  rate for Down syndrome and can be positive for other chromosome abnormalities as well as congenital heart defects.  When combined with a maternal serum marker screening, the detection rate is up to 90% for Down syndrome and up to 97% for trisomy 18.   Given current recommendations during COVID, we are offering only the biochemical testing portion of this testing (without the ultrasound and NT portion), which has a much lower detection rate.  Maternal serum marker screening, or "quad" screen, is a blood test that measures pregnancy proteins, can provide risk assessments for Down syndrome, trisomy 18, and open neural tube defects (spina bifida, anencephaly). Because it does not directly examine the fetus, it cannot positively diagnose or rule out these problems. This is a second trimester option which could be offered along with the anatomy ultrasound. It can detect approximately 75% of babies with Down syndrome, 80% of babies with open spina bifida and 70% of babies with trisomy 2318.  Targeted ultrasound uses high frequency sound waves to create an image of the developing fetus.  An ultrasound is often recommended as a routine means of evaluating the pregnancy.  It is also used to screen for fetal anatomy problems (for example, a heart defect) that might be suggestive of a chromosomal or other abnormality. We are currently not recommending a first trimester ultrasound other than that which would be ordered for dating and viability.  Should these screening tests indicate an increased concern, then the following additional testing options would be offered:  The chorionic villus sampling procedure is available for first trimester chromosome analysis.  This involves the withdrawal of a small amount of chorionic  villi (tissue from the developing placenta).  Risk of pregnancy loss is estimated to be approximately 1 in 200 to 1 in 100 (0.5 to 1%).  There is approximately a 1% (1 in 100) chance that the CVS chromosome  results will be unclear.  Chorionic villi cannot be tested for neural tube defects.     Amniocentesis involves the removal of a small amount of amniotic fluid from the sac surrounding the fetus with the use of a thin needle inserted through the maternal abdomen and uterus.  Ultrasound guidance is used throughout the procedure.  Fetal cells from amniotic fluid are directly evaluated and > 99.5% of chromosome problems and > 98% of open neural tube defects can be detected. This procedure is generally performed after the 15th week of pregnancy.  The main risks to this procedure include complications leading to miscarriage in less than 1 in 200 cases (0.5%).  Cystic Fibrosis and Spinal Muscular Atrophy (SMA) screening were also discussed with the patient. Both conditions are recessive, which means that both parents must be carriers in order to have a child with the disease.  Cystic fibrosis (CF) is one of the most common genetic conditions in persons of Caucasian ancestry.  This condition occurs in approximately 1 in 2,500 Caucasian persons and results in thickened secretions in the lungs, digestive, and reproductive systems.  For a baby to be at risk for having CF, both of the parents must be carriers for this condition.  Approximately 1 in 50 Caucasian persons is a carrier for CF.  Current carrier testing looks for the most common mutations in the gene for CF and can detect approximately 90% of carriers in the Caucasian population.  This means that the carrier screening can greatly reduce, but cannot eliminate, the chance for an individual to have a child with CF.  If an individual is found to be a carrier for CF, then carrier testing would be available for the partner. As part of [facility] newborn screening profile, all babies born in the state of [place] will have a two-tier screening process.  Specimens are first tested to determine the concentration of immunoreactive trypsinogen (IRT).  The top 5%  of specimens with the highest IRT values then undergo DNA testing using a panel of over 40 common CF mutations. SMA is a neurodegenerative disorder that leads to atrophy of skeletal muscle and overall weakness.  This condition is also more prevalent in the Caucasian population, with 1 in 40-1 in 60 persons being a carrier and 1 in 6,000-1 in 10,000 children being affected.  There are multiple forms of the disease, with some causing death in infancy to other forms with survival into adulthood.  The genetics of SMA is complex, but carrier screening can detect up to 95% of carriers in the Caucasian population.  Similar to CF, a negative result can greatly reduce, but cannot eliminate, the chance to have a child with SMA. The patient declined carrier screening for CF and SMA.  We talked about the option of signing up for Early Check to have the baby tested for SMA after delivery as part of a new study in [place].  This registration can be done online prior to delivery if desired.  We obtained a detailed family history and pregnancy history.  The patient stated that she has two sons, one of whom has ADHD.  The older son lives with her mother and the younger son was adopted outside of the family.  The patient also  has ADHD.  Her partner, Jill AlexandersJustin, is reported to have ADHD and bipolar disorder.  He also has an extensive family history of mental health conditions in both parents, a maternal aunt, uncle and maternal grandmother. We discussed that mental health conditions are known to have strong inherited components in many families, though the specific genes are not well understood.  When there are multiple affected generations, there may be up to a 50% chance for recurrence in the children of an affected individual.  ADHD is also not well understood, but likely has strong genetic factors in many families. Lastly, the patient reported that her partner has a maternal aunt with autism.  She is also reported to have bipolar and  schizophrenia and lives in a group home.  They are not aware of other health conditions or if any genetic testing has been performed to help determine the cause for her condition.  We reviewed that there may be various reasons for autism, and that in many cases, the specific cause is not known.  While there are specific genetic syndromes associated with autism including Fragile X syndrome, many cases are thought to be multifactorial.  Without a known diagnosis, it is difficult to assess the chances for this pregnancy to have autism, but we offered the option of Fragile X carrier screening for her partner, which she declined.  The remainder of the family history was reported to be unremarkable for birth defects, intellectual delays, recurrent pregnancy loss or known chromosome abnormalities.  We also obtained a detailed pregnancy history.  The patient reported no complications thus far in the pregnancy or exposures to recreational drugs, smoking or alcohol.  She does take Adderall as prescribed by her doctor for ADHD.  Though she stated that she has been trying to cut back on this medication, she feels like she needs it for daily functioning for work.  We reviewed the Reprotox data on this medication which has not shown an increased risk for specific birth defects, but has shown concern for withdrawal in the neonatal period.  There is also data to suggest IUGR or CNS developmental differences with this medication, but this is thought to be less concerning when taken at prescribed doses.   After consideration of the options, Ms. Wendy Warren elected to have MaterniT21 PLUS with SCA drawn [facility] which we scheduled for Thursday, 09/07/18 at 1:00pm.  This appointment will be for a viability/dating ultrasound and labs.  This will minimize the number of visits that she needs to attend in person.  The patient declined carrier testing for hemoglobinopathies, CF, SMA and Fragile X  syndrome.  The patient was encouraged to call with questions or concerns.  We can be contacted at [phone].  Labs ordered: none today, to be drawn at time of ultrasound  Cherly Andersoneborah F. Wells, MS, CGC  I provided 30 minutes of non-face-to-face time during this encounter.   Wells, Sindy Guadeloupeeborah   Deborah Wells, MS, CGC performed an integral service incident to the physician's initial service.  I was physically present in the clinical area and was immediately available to render assistance.   Enos Muhl C Manley Fason

## 2018-09-07 ENCOUNTER — Ambulatory Visit
Admission: RE | Admit: 2018-09-07 | Discharge: 2018-09-07 | Disposition: A | Discharge status: Home / Self Care | Payer: BLUE CROSS/BLUE SHIELD | Source: Ambulatory Visit | Attending: Obstetrics and Gynecology | Admitting: Obstetrics and Gynecology

## 2018-09-07 ENCOUNTER — Other Ambulatory Visit: Payer: Self-pay

## 2018-09-07 DIAGNOSIS — Z369 Encounter for antenatal screening, unspecified: Secondary | ICD-10-CM

## 2018-09-07 DIAGNOSIS — Z3480 Encounter for supervision of other normal pregnancy, unspecified trimester: Secondary | ICD-10-CM | POA: Diagnosis present

## 2018-09-07 DIAGNOSIS — Z3481 Encounter for supervision of other normal pregnancy, first trimester: Secondary | ICD-10-CM | POA: Diagnosis not present

## 2018-09-12 LAB — MATERNIT21 PLUS CORE+SCA
Fetal Fraction: 7
Monosomy X (Turner Syndrome): NOT DETECTED
Result (T21): NEGATIVE
Trisomy 13 (Patau syndrome): NEGATIVE
Trisomy 18 (Edwards syndrome): NEGATIVE
Trisomy 21 (Down syndrome): NEGATIVE
XXX (Triple X Syndrome): NOT DETECTED
XXY (Klinefelter Syndrome): NOT DETECTED
XYY (Jacobs Syndrome): NOT DETECTED

## 2018-09-18 ENCOUNTER — Telehealth: Payer: Self-pay | Admitting: Obstetrics and Gynecology

## 2018-09-18 NOTE — Telephone Encounter (Signed)
The patient was informed of the results of her recent MaterniT21 testing which yielded NEGATIVE results.  The patient's specimen showed DNA consistent with two copies of chromosomes 21, 18 and 13.  The sensitivity for trisomy 56, trisomy 83 and trisomy 16 using this testing are reported as 99.1%, 99.9% and 91.7% respectively.  Thus, while the results of this testing are highly accurate, they are not considered diagnostic at this time.  Should more definitive information be desired, the patient may still consider amniocentesis.   As requested to know by the patient, sex chromosome analysis was included for this sample.  Results are consistent with a female fetus (no Y chromosome detected). This is predicted with >99% accuracy.  A maternal serum AFP only should be considered if screening for neural tube defects is desired.  We may be reached at [phone] with any questions or concerns.   Wilburt Finlay, MS, CGC

## 2018-09-19 ENCOUNTER — Encounter: Payer: Self-pay | Admitting: Advanced Practice Midwife

## 2018-09-19 ENCOUNTER — Ambulatory Visit: Payer: Medicaid Other

## 2018-09-19 ENCOUNTER — Other Ambulatory Visit: Payer: Self-pay

## 2018-09-19 ENCOUNTER — Ambulatory Visit: Payer: Self-pay | Admitting: Advanced Practice Midwife

## 2018-09-19 DIAGNOSIS — Z3480 Encounter for supervision of other normal pregnancy, unspecified trimester: Secondary | ICD-10-CM

## 2018-09-19 DIAGNOSIS — Z36 Encounter for antenatal screening for chromosomal anomalies: Secondary | ICD-10-CM

## 2018-09-19 DIAGNOSIS — F909 Attention-deficit hyperactivity disorder, unspecified type: Secondary | ICD-10-CM

## 2018-09-19 DIAGNOSIS — F1290 Cannabis use, unspecified, uncomplicated: Secondary | ICD-10-CM

## 2018-09-19 NOTE — Progress Notes (Signed)
   TELEPHONE OBSTETRICS VISIT ENCOUNTER NOTE  I connected with@ on 09/19/18 at  8:20 AM EDT by telephone at home and verified that I am speaking with the correct person using two identifiers.   I discussed the limitations, risks, security and privacy concerns of performing an evaluation and management service by telephone and the availability of in person appointments. I also discussed with the patient that there may be a patient responsible charge related to this service. The patient expressed understanding and agreed to proceed.  Epic is down from 8-1:55 so unable to access pt chart during this encounter  Subjective:  Wendy Warren is a 33 y.o. G3P2002 at [redacted]w[redacted]d being followed for ongoing prenatal care.  She is currently monitored for the following issues for this low-risk pregnancy and has ADHD; Supervision of other normal pregnancy, antepartum; Type A blood, Rh positive; Marijuana use; Encounter for antenatal screening for chromosomal anomalies; and Family history of autism on their problem list.  Patient reports no complaints. Reports fetal movement. Denies any contractions, bleeding or leaking of fluid.   The following portions of the patient's history were reviewed and updated as appropriate: allergies, current medications, past family history, past medical history, past social history, past surgical history and problem list.   Objective:   General:  Alert, oriented and cooperative.   Mental Status: Normal mood and affect perceived. Normal judgment and thought content.  Rest of physical exam deferred due to type of encounter  Assessment and Plan:  Pregnancy: G3P2002 at [redacted]w[redacted]d 1. Attention deficit hyperactivity disorder (ADHD), unspecified ADHD type Taking Adderall 30 mg daily with good results  2. Marijuana use States last cig mid May.  Then vaped x2 weeks and quit.  Last MJ 07/2018  3. Encounter for antenatal screening for chromosomal anomalies Pt states had genetic counseling  telephone appt 08/31/18 and FIRST screen drawn 09/07/18=neg  4. Supervision of other normal pregnancy, antepartum Feels well.  Taking PNV.  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  Living with husband.  Anatomy u/s 10/19/18  Preterm labor symptoms and general obstetric precautions including but not limited to vaginal bleeding, contractions, leaking of fluid and fetal movement were reviewed in detail with the patient.  I discussed the assessment and treatment plan with the patient. The patient was provided an opportunity to ask questions and all were answered. The patient agreed with the plan and demonstrated an understanding of the instructions. The patient was advised to call back or seek an in-person office evaluation/go to the hospital for any urgent or concerning symptoms.  Please refer to After Visit Summary for other counseling recommendations.   I provided 11 minutes of non-face-to-face time during this encounter.  Return in about 4 weeks (around 10/17/2018) for routine PNC.  Future Appointments  Date Time Provider [place]  10/19/2018  9:00 AM [facility] [place] [facility]  10/20/2018 10:00 AM [facility] PROVIDER [facility] None    Herbie Saxon, CNM

## 2018-09-19 NOTE — Progress Notes (Signed)
Call to client for [place] visit; confirmed identity, discussed limitations and benefits of visit, and informed is a billable visit-agrees to visit; taking PNV; aware of 10/19/18 u/s appt; needs PNV Rx sent to [place], RN

## 2018-09-25 ENCOUNTER — Emergency Department
Admission: EM | Admit: 2018-09-25 | Discharge: 2018-09-25 | Discharge status: Against Medical Advice | Payer: BC Managed Care – PPO | Attending: Emergency Medicine | Admitting: Emergency Medicine

## 2018-09-25 ENCOUNTER — Other Ambulatory Visit: Payer: Self-pay

## 2018-09-25 DIAGNOSIS — R102 Pelvic and perineal pain: Secondary | ICD-10-CM | POA: Insufficient documentation

## 2018-09-25 DIAGNOSIS — Z5321 Procedure and treatment not carried out due to patient leaving prior to being seen by health care provider: Secondary | ICD-10-CM | POA: Insufficient documentation

## 2018-09-25 DIAGNOSIS — Z3A16 16 weeks gestation of pregnancy: Secondary | ICD-10-CM | POA: Insufficient documentation

## 2018-09-25 DIAGNOSIS — O9989 Other specified diseases and conditions complicating pregnancy, childbirth and the puerperium: Secondary | ICD-10-CM | POA: Diagnosis present

## 2018-09-25 NOTE — ED Notes (Signed)
No answer when called several times from lobby 
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
## 2018-09-26 ENCOUNTER — Telehealth: Payer: Self-pay | Admitting: Emergency Medicine

## 2018-09-26 NOTE — Telephone Encounter (Signed)
Called patient due to lwot to inquire about condition and follow up plans.  No answer and no voicemail  

## 2018-09-29 ENCOUNTER — Encounter: Payer: Self-pay | Admitting: Family Medicine

## 2018-10-12 NOTE — Addendum Note (Signed)
Addended by: Cletis Media on: 10/12/2018 09:31 AM   Modules accepted: Orders

## 2018-10-16 ENCOUNTER — Other Ambulatory Visit: Payer: Self-pay

## 2018-10-16 DIAGNOSIS — Z3480 Encounter for supervision of other normal pregnancy, unspecified trimester: Secondary | ICD-10-CM

## 2018-10-18 ENCOUNTER — Encounter: Payer: Self-pay | Admitting: Advanced Practice Midwife

## 2018-10-18 DIAGNOSIS — Z3490 Encounter for supervision of normal pregnancy, unspecified, unspecified trimester: Secondary | ICD-10-CM | POA: Insufficient documentation

## 2018-10-19 ENCOUNTER — Telehealth: Payer: Self-pay

## 2018-10-19 ENCOUNTER — Inpatient Hospital Stay: Admission: RE | Admit: 2018-10-19 | Payer: BLUE CROSS/BLUE SHIELD | Source: Ambulatory Visit

## 2018-10-19 NOTE — ED Notes (Signed)
Pt called to cancel appointment this morning at [facility] because she is in [place] for Covid. She will reschedule with [place] when her test results come back.

## 2018-10-19 NOTE — Telephone Encounter (Signed)
While prepping chart for [facility] RV appt tomorrow, read note where client cancelled [facility] appt for today as quarantined pending Covid resullts. Per client, received call from [facility] today and Covid test was negative. Client states has already rescheduled [facility] for 10/52020 at 1 pm. Client aware of [facility] RV appt tomorrow. Verified negative Covid results with Junious Dresser RN on Covid team. Rich Number, RN

## 2018-10-20 ENCOUNTER — Ambulatory Visit: Payer: Self-pay | Admitting: Family Medicine

## 2018-10-20 ENCOUNTER — Other Ambulatory Visit: Payer: Self-pay

## 2018-10-20 DIAGNOSIS — F1290 Cannabis use, unspecified, uncomplicated: Secondary | ICD-10-CM

## 2018-10-20 DIAGNOSIS — Z3480 Encounter for supervision of other normal pregnancy, unspecified trimester: Secondary | ICD-10-CM

## 2018-10-20 MED ORDER — PRENATAL VITAMIN 27-0.8 MG PO TABS: 1.0000 | ORAL_TABLET | Freq: Every day | ORAL | 0 refills | Status: DC

## 2018-10-20 NOTE — Progress Notes (Signed)
Patient here for maternity RV at 20 wks. DP U/S  On 10/30/2018. Needs to sign Blood Transfusion consent today. More PNV given today.Jenetta Downer, RN

## 2018-10-20 NOTE — Progress Notes (Signed)
   PRENATAL VISIT NOTE  Subjective:  Wendy Warren is a 33 y.o. G3P2002 at [redacted]w[redacted]d being seen today for ongoing prenatal care.  She is currently monitored for the following issues for this low-risk pregnancy and has ADHD; Supervision of other normal pregnancy, antepartum; Type A blood, Rh positive; Marijuana use; Encounter for antenatal screening for chromosomal anomalies; Family history of autism; and youngest son given up for adoption on their problem list.  Patient reports no complaints.  Contractions: Not present. Vag. Bleeding: None.  Movement: Present. Denies leaking of fluid/ROM.   The following portions of the patient's history were reviewed and updated as appropriate: allergies, current medications, past family history, past medical history, past social history, past surgical history and problem list. Problem list updated.  Objective:   Vitals:   10/20/18 0952  BP: 107/69  Temp: (!) 97.2 F (36.2 C)  Weight: 177 lb 6.4 oz (80.5 kg)    Fetal Status: Fetal Heart Rate (bpm): 145 Fundal Height: 20 cm Movement: Present     General:  Alert, oriented and cooperative. Patient is in no acute distress.  Skin: Skin is warm and dry. No rash noted.   Cardiovascular: Normal heart rate noted  Respiratory: Normal respiratory effort, no problems with respiration noted  Abdomen: Soft, gravid, appropriate for gestational age.  Pain/Pressure: Absent     Pelvic: Cervical exam deferred        Extremities: Normal range of motion.  Edema: None  Mental Status: Normal mood and affect. Normal behavior. Normal judgment and thought content.   Assessment and Plan:  Pregnancy: G3P2002 at [redacted]w[redacted]d  1. Marijuana use Repeat UDS in future   2. Supervision of other normal pregnancy, antepartum Up to date - Prenatal Vit-Fe Fumarate-FA (PRENATAL VITAMIN) 27-0.8 MG TABS; Take 1 tablet by mouth daily.  Dispense: 100 tablet; Refill: 0   Preterm labor symptoms and general obstetric precautions including but  not limited to vaginal bleeding, contractions, leaking of fluid and fetal movement were reviewed in detail with the patient. Please refer to After Visit Summary for other counseling recommendations.  Return in about 4 weeks (around 11/17/2018) for Routine prenatal care, Telehealth/Virtual health GYN visit.  Future Appointments  Date Time Provider [place]  10/30/2018  1:00 PM [facility] [place] [facility]  11/17/2018  9:00 AM [facility] PROVIDER [facility] None    Caren Macadam, MD

## 2018-10-26 ENCOUNTER — Other Ambulatory Visit: Payer: Self-pay

## 2018-10-26 DIAGNOSIS — Z3480 Encounter for supervision of other normal pregnancy, unspecified trimester: Secondary | ICD-10-CM

## 2018-10-30 ENCOUNTER — Ambulatory Visit
Admission: RE | Admit: 2018-10-30 | Discharge: 2018-10-30 | Disposition: A | Discharge status: Home / Self Care | Payer: BC Managed Care – PPO | Source: Ambulatory Visit | Attending: Obstetrics and Gynecology | Admitting: Obstetrics and Gynecology

## 2018-10-30 ENCOUNTER — Other Ambulatory Visit: Payer: Self-pay

## 2018-10-30 DIAGNOSIS — Z3A21 21 weeks gestation of pregnancy: Secondary | ICD-10-CM | POA: Insufficient documentation

## 2018-10-30 DIAGNOSIS — E669 Obesity, unspecified: Secondary | ICD-10-CM | POA: Insufficient documentation

## 2018-10-30 DIAGNOSIS — Z3480 Encounter for supervision of other normal pregnancy, unspecified trimester: Secondary | ICD-10-CM

## 2018-10-30 DIAGNOSIS — O99212 Obesity complicating pregnancy, second trimester: Secondary | ICD-10-CM | POA: Insufficient documentation

## 2018-10-30 NOTE — ED Notes (Signed)
Pt to [facility] today for scheduled [place].  Pt has been out of Quarantine and her test at [facility] was negative.

## 2018-10-31 ENCOUNTER — Telehealth: Payer: Self-pay

## 2018-10-31 NOTE — Telephone Encounter (Signed)
Attempted to call pt. To inform of work restrictions letter ready per Dr. Ernestina Patches.  No answer Debera Lat, RN

## 2018-11-01 NOTE — Telephone Encounter (Signed)
TC to patient, no answer, and unable to LM.Marland KitchenJenetta Downer, RN

## 2018-11-07 NOTE — Telephone Encounter (Signed)
Call to inform client work note is ready for pick up and per recorded message, number is not available. Rich Number, RN

## 2018-11-17 ENCOUNTER — Telehealth: Payer: Self-pay

## 2018-11-17 ENCOUNTER — Ambulatory Visit: Payer: Self-pay

## 2018-11-17 NOTE — Telephone Encounter (Signed)
Attempted to call for [facility] Telehealth visit; phone rang and then changed to busy signal Debera Lat, RN

## 2018-11-17 NOTE — Telephone Encounter (Signed)
Reached client for [place] visit but reports is at work and needs to reschedule appt.; rescheduled for 11/21/18; desires to pick up work note-will leave at info. Brandon Melnick, RN

## 2018-11-21 ENCOUNTER — Ambulatory Visit: Payer: Self-pay

## 2018-11-21 ENCOUNTER — Telehealth: Payer: Self-pay | Admitting: Family Medicine

## 2018-11-21 ENCOUNTER — Telehealth: Payer: Self-pay

## 2018-11-21 NOTE — Telephone Encounter (Signed)
Calling patient to reschedule, phones were not working this morning. No answer, unable to leave a VM.

## 2018-11-21 NOTE — Telephone Encounter (Signed)
ATC x2 for [facility] Telehealth appt. Both times call was disconnected from patient end.  Hal Morales, RN

## 2018-11-29 ENCOUNTER — Telehealth: Payer: Self-pay

## 2018-11-29 NOTE — Telephone Encounter (Signed)
TC to patient about 11/30/2018 maternity appointment. Patient has not been seen in clinic since 10/20/2018, and needs to be seen in clinic instead of telehealth appointment. Patient was to have a 11/21/2018 telehealth appointment, but [facility] phones were not working that day. Patient agreed to come in to clinic for 11/30/2018 appointment. Appointment notes updated.Jenetta Downer, RN

## 2018-11-30 ENCOUNTER — Other Ambulatory Visit: Payer: Self-pay

## 2018-11-30 ENCOUNTER — Encounter: Payer: Self-pay | Admitting: Physician Assistant

## 2018-11-30 ENCOUNTER — Ambulatory Visit: Payer: Self-pay | Admitting: Physician Assistant

## 2018-11-30 VITALS — BP 120/72 | Temp 97.0°F | Wt 187.2 lb

## 2018-11-30 DIAGNOSIS — Z23 Encounter for immunization: Secondary | ICD-10-CM

## 2018-11-30 DIAGNOSIS — Z3480 Encounter for supervision of other normal pregnancy, unspecified trimester: Secondary | ICD-10-CM

## 2018-11-30 NOTE — Progress Notes (Signed)
   PRENATAL VISIT NOTE  Subjective:  LAYNIE ESPY is a 33 y.o. G3P2002 at [redacted]w[redacted]d being seen today for ongoing prenatal care.  She is currently monitored for the following issues for this low-risk pregnancy and has ADHD; Supervision of other normal pregnancy, antepartum; Type A blood, Rh positive; Marijuana use; Encounter for antenatal screening for chromosomal anomalies; Family history of autism; and youngest son given up for adoption on their problem list.  Patient reports no complaints.  Contractions: Not present. Vag. Bleeding: None.  Movement: Present. Denies leaking of fluid/ROM.   The following portions of the patient's history were reviewed and updated as appropriate: allergies, current medications, past family history, past medical history, past social history, past surgical history and problem list. Problem list updated.  Objective:   Vitals:   11/30/18 1001  BP: 120/72  Temp: (!) 97 F (36.1 C)  Weight: 187 lb 3.2 oz (84.9 kg)    Fetal Status: Fetal Heart Rate (bpm): 148 Fundal Height: 24 cm Movement: Present     General:  Alert, oriented and cooperative. Patient is in no acute distress.  Skin: Skin is warm and dry. No rash noted.   Cardiovascular: Normal heart rate noted  Respiratory: Normal respiratory effort, no problems with respiration noted  Abdomen: Soft, gravid, appropriate for gestational age.  Pain/Pressure: Absent     Pelvic: Cervical exam deferred        Extremities: Normal range of motion.     Mental Status: Normal mood and affect. Normal behavior. Normal judgment and thought content.   Assessment and Plan:  Pregnancy: G3P2002 at [redacted]w[redacted]d  1. Supervision of other normal pregnancy, antepartum Feels well, here with husband, supportive. Enc flu vaccine - pt desires. Anticipatory guidance re: 28-wk labs at RV. - Flu Vaccine QUAD 36+ mos IM  2. Flu vaccine need  - Flu Vaccine QUAD 36+ mos IM   Preterm labor symptoms and general obstetric precautions  including but not limited to vaginal bleeding, contractions, leaking of fluid and fetal movement were reviewed in detail with the patient. Please refer to After Visit Summary for other counseling recommendations.  Return in about 2 weeks (around 12/14/2018) for Routine prenatal care, 28 wk labs.  Future Appointments  Date Time Provider [place]  12/14/2018  8:20 AM [facility] PROVIDER [facility] None    Lora Havens, PA-C

## 2018-11-30 NOTE — Progress Notes (Addendum)
Here today for 25.6 week MH RV. Taking PNV QD, PTL s/s info given and explained. PHQ9 and CCNC forms today. Hal Morales, RN Flu vaccine given and tolerated well. Hal Morales, RN

## 2018-12-14 ENCOUNTER — Encounter: Payer: Self-pay | Admitting: Physician Assistant

## 2018-12-14 ENCOUNTER — Ambulatory Visit: Payer: Self-pay | Admitting: Physician Assistant

## 2018-12-14 ENCOUNTER — Other Ambulatory Visit: Payer: Self-pay

## 2018-12-14 DIAGNOSIS — Z3480 Encounter for supervision of other normal pregnancy, unspecified trimester: Secondary | ICD-10-CM

## 2018-12-14 LAB — OB RESULTS CONSOLE HIV ANTIBODY (ROUTINE TESTING): HIV: NONREACTIVE

## 2018-12-14 LAB — HEMOGLOBIN, FINGERSTICK: Hemoglobin: 11.2 g/dL (ref 11.1–15.9)

## 2018-12-14 NOTE — Progress Notes (Signed)
Here today for 27.6 week MH RV. Taking PNV QD, denies ED/hospital visits since last RV. 28 week labs and Tdap today. Alilah Mcmeans, RN  

## 2018-12-14 NOTE — Progress Notes (Signed)
   PRENATAL VISIT NOTE  Subjective:  Wendy Warren is a 33 y.o. G3P2002 at [redacted]w[redacted]d being seen today for ongoing prenatal care. Here with husband, supportive. She is currently monitored for the following issues for this low-risk pregnancy and has ADHD; Supervision of other normal pregnancy, antepartum; Type A blood, Rh positive; Marijuana use; Encounter for antenatal screening for chromosomal anomalies; Family history of autism; and youngest son given up for adoption on their problem list. No probs with flu vaccine given at prior visit. Agrees to Tdap today.  Patient reports R low back/hip pain in certain postitions.  Contractions: Not present. Vag. Bleeding: None.  Movement: Present. Denies leaking of fluid/ROM.   The following portions of the patient's history were reviewed and updated as appropriate: allergies, current medications, past family history, past medical history, past social history, past surgical history and problem list. Problem list updated.  Objective:   Vitals:   12/14/18 0929  BP: 116/71  Temp: (!) 97.4 F (36.3 C)  Weight: 194 lb (88 kg)    Fetal Status: Fetal Heart Rate (bpm): 144 Fundal Height: 29 cm Movement: Present     General:  Alert, oriented and cooperative. Patient is in no acute distress.  Skin: Skin is warm and dry. No rash noted.   Cardiovascular: Normal heart rate noted  Respiratory: Normal respiratory effort, no problems with respiration noted  Abdomen: Soft, gravid, appropriate for gestational age.  Pain/Pressure: Absent     Pelvic: Cervical exam deferred        Extremities: Normal range of motion.  Edema: None  Mental Status: Normal mood and affect. Normal behavior. Normal judgment and thought content.   Assessment and Plan:  Pregnancy: G3P2002 at [redacted]w[redacted]d  1. Supervision of other normal pregnancy, antepartum Doing well, suspect sciatic nerve irritation causing hip pain. Enc walking, frequent position changes, pillow between knees when sleeping.  Consider physical therapy referral - pt declines at present.  Anticipatory guidance re: transition to visits every 2 weeks, has BP cuff at home (plans to obtain scale), and agrees to telehealth RV. - Hemoglobin, venipuncture   Preterm labor symptoms and general obstetric precautions including but not limited to vaginal bleeding, contractions, leaking of fluid and fetal movement were reviewed in detail with the patient. Please refer to After Visit Summary for other counseling recommendations.  Return in about 2 weeks (around 12/28/2018) for Routine prenatal care, Telehealth.  Future Appointments  Date Time Provider [place]  12/28/2018  8:20 AM AC-[facility] PROVIDER AC-MAT None    Lora Havens, PA-C

## 2018-12-15 LAB — RPR: RPR Ser Ql: NONREACTIVE

## 2018-12-15 LAB — GLUCOSE, 1 HOUR GESTATIONAL: Gestational Diabetes Screen: 102 mg/dL (ref 65–139)

## 2018-12-20 NOTE — Addendum Note (Signed)
Addended by: Cletis Media on: 12/20/2018 02:40 PM   Modules accepted: Orders

## 2018-12-28 ENCOUNTER — Other Ambulatory Visit: Payer: Self-pay

## 2018-12-28 ENCOUNTER — Ambulatory Visit: Payer: BLUE CROSS/BLUE SHIELD | Admitting: Physician Assistant

## 2018-12-28 ENCOUNTER — Encounter: Payer: Self-pay | Admitting: Physician Assistant

## 2018-12-28 DIAGNOSIS — F1290 Cannabis use, unspecified, uncomplicated: Secondary | ICD-10-CM

## 2018-12-28 DIAGNOSIS — Z3480 Encounter for supervision of other normal pregnancy, unspecified trimester: Secondary | ICD-10-CM

## 2018-12-28 NOTE — Progress Notes (Signed)
   TELEPHONE OBSTETRICS VISIT ENCOUNTER NOTE  I connected with@ on 12/28/18 at  8:20 AM EST by telephone at home and verified that I am speaking with the correct person using two identifiers.   I discussed the limitations, risks, security and privacy concerns of performing an evaluation and management service by telephone and the availability of in person appointments. I also discussed with the patient that there may be a patient responsible charge related to this service. The patient expressed understanding and agreed to proceed.  Subjective:  Wendy Warren is a 33 y.o. G3P2002 at [redacted]w[redacted]d being followed for ongoing prenatal care.  She is currently monitored for the following issues for this low-risk pregnancy and has ADHD; Supervision of other normal pregnancy, antepartum; Type A blood, Rh positive; Marijuana use; Encounter for antenatal screening for chromosomal anomalies; Family history of autism; and youngest son given up for adoption on their problem list.  Patient reports she tripped at work 2 days ago, did not fall, but strained her hip/low back. Was able to finish shift. Took Tylenol with some relief. Pain significantly better. . Reports fetal movement. Denies any contractions, bleeding or leaking of fluid.   The following portions of the patient's history were reviewed and updated as appropriate: allergies, current medications, past family history, past medical history, past social history, past surgical history and problem list.   Objective:   General:  Alert, oriented and cooperative.   Mental Status: Normal mood and affect perceived. Normal judgment and thought content.  Rest of physical exam deferred due to type of encounter  Assessment and Plan:  Pregnancy: G3P2002 at [redacted]w[redacted]d 1. Marijuana use Denies use in many months. Enc ongoing abstinence. No probs with quitting tobacco 06/2018.  2. Supervision of other normal pregnancy, antepartum Ok to use local ice and continue Tylenol for  probable low back/hip strain. Still taking Adderall 15mg  qd on work days for ADHD, plans to discontinue when she starts maternity leave. To call prior to RV if problems or concerns.  Preterm labor symptoms and general obstetric precautions including but not limited to vaginal bleeding, contractions, leaking of fluid and fetal movement were reviewed in detail with the patient.  I discussed the assessment and treatment plan with the patient. The patient was provided an opportunity to ask questions and all were answered. The patient agreed with the plan and demonstrated an understanding of the instructions. The patient was advised to call back or seek an in-person office evaluation/go to the hospital for any urgent or concerning symptoms.  Please refer to After Visit Summary for other counseling recommendations.   I provided 6 minutes of non-face-to-face time during this encounter.  Return in about 2 weeks (around 01/11/2019) for Routine prenatal care.  Future Appointments  Date Time Provider [place]  01/11/2019  8:20 AM AC-[facility] PROVIDER AC-MAT None    Lora Havens, PA-C [facility]

## 2018-12-28 NOTE — Progress Notes (Signed)
TC to patient for maternity telehealth appointment. Patient identity verified, patient is at home and states she feels comfortable to talk at this time. Patient self reporting BP and weight. Patient scheduled for 2 week revisit in clinic. Patient states she will be available by phone for next 20 minutes for provider call.Marland KitchenMarland KitchenJenetta Downer, RN

## 2019-01-11 ENCOUNTER — Encounter: Payer: Self-pay | Admitting: Physician Assistant

## 2019-01-11 ENCOUNTER — Other Ambulatory Visit: Payer: Self-pay

## 2019-01-11 ENCOUNTER — Ambulatory Visit: Payer: Self-pay | Admitting: Physician Assistant

## 2019-01-11 VITALS — BP 136/80 | HR 101 | Temp 97.7°F | Wt 198.4 lb

## 2019-01-11 DIAGNOSIS — F1290 Cannabis use, unspecified, uncomplicated: Secondary | ICD-10-CM

## 2019-01-11 DIAGNOSIS — R030 Elevated blood-pressure reading, without diagnosis of hypertension: Secondary | ICD-10-CM | POA: Insufficient documentation

## 2019-01-11 DIAGNOSIS — Z3480 Encounter for supervision of other normal pregnancy, unspecified trimester: Secondary | ICD-10-CM

## 2019-01-11 LAB — URINALYSIS
Bilirubin, UA: NEGATIVE
Glucose, UA: NEGATIVE
Leukocytes,UA: NEGATIVE
Nitrite, UA: NEGATIVE
Protein,UA: NEGATIVE
Specific Gravity, UA: 1.02 (ref 1.005–1.030)
Urobilinogen, Ur: 0.2 mg/dL (ref 0.2–1.0)
pH, UA: 7 (ref 5.0–7.5)

## 2019-01-11 NOTE — Progress Notes (Signed)
In for visit; taking PNV & Adderal; denies hospital visits Debera Lat, RN

## 2019-01-11 NOTE — Progress Notes (Deleted)
Trace bilat hand swelling. 

## 2019-01-11 NOTE — Progress Notes (Signed)
   PRENATAL VISIT NOTE  Subjective:  Wendy Warren is a 33 y.o. G3P2002 at [redacted]w[redacted]d being seen today for ongoing prenatal care. She is here with FOB today, he is supportive. She is currently monitored for the following issues for this low-risk pregnancy and has ADHD; Supervision of other normal pregnancy, antepartum; Type A blood, Rh positive; Marijuana use; Encounter for antenatal screening for chromosomal anomalies; Family history of autism; youngest son given up for adoption; and Elevated BP without diagnosis of hypertension on their problem list.  Patient reports occ sharp brief inguinal pain with movement.  Contractions: Not present. Vag. Bleeding: None.  Movement: Present. Reports some bilat hand swelling. Denies leaking of fluid/ROM.   The following portions of the patient's history were reviewed and updated as appropriate: allergies, current medications, past family history, past medical history, past social history, past surgical history and problem list. Problem list updated.  Objective:   Vitals:   01/11/19 0805  BP: 136/80  Pulse: (!) 101  Temp: 97.7 F (36.5 C)  Weight: 198 lb 6.4 oz (90 kg)    Fetal Status: Fetal Heart Rate (bpm): 136 Fundal Height: 32 cm Movement: Present     General:  Alert, oriented and cooperative. Patient is in no acute distress.  Skin: Skin is warm and dry. No rash noted.   Cardiovascular: Normal heart rate noted  Respiratory: Normal respiratory effort, no problems with respiration noted  Abdomen: Soft, gravid, appropriate for gestational age.  Pain/Pressure: Absent     Pelvic: Cervical exam deferred        Extremities: Normal range of motion.  Edema: Trace  Mental Status: Normal mood and affect. Normal behavior. Normal judgment and thought content.  Trace bilat hand swelling noted on exam. Assessment and Plan:  Pregnancy: G3P2002 at [redacted]w[redacted]d  1. Marijuana use Denies recent use. Gives verbal sent to repeat UDS today. - 938182 Drug Screen  2.  Supervision of other normal pregnancy, antepartum Initial BP slightly elevated, repeat 102/60 after pt lying on her L side for 10 min. Denies HA, RUQ pain. Occ sees spots. Mild bilat hand edema today, none in LE/face. Plan to continue to monitor BP, pt has cuff at home. Preeclampsia sx reviewed with pt. No protein on CCUA. - Urinalysis (Urine Dip)   Preterm labor symptoms and general obstetric precautions including but not limited to vaginal bleeding, contractions, leaking of fluid and fetal movement were reviewed in detail with the patient. Please refer to After Visit Summary for other counseling recommendations.  Return in about 2 weeks (around 01/25/2019) for Telehealth, Routine prenatal care.  No future appointments.  Lora Havens, PA-C

## 2019-01-14 LAB — 789231 7+OXYCODONE-BUND
Amphetamines, Urine: NEGATIVE ng/mL
BENZODIAZ UR QL: NEGATIVE ng/mL
Barbiturate screen, urine: NEGATIVE ng/mL
Cocaine (Metab.): NEGATIVE ng/mL
OPIATE SCREEN URINE: NEGATIVE ng/mL
Oxycodone/Oxymorphone, Urine: NEGATIVE ng/mL
PCP Quant, Ur: NEGATIVE ng/mL

## 2019-01-14 LAB — CANNABINOID CONFIRMATION, UR
CANNABINOIDS: POSITIVE — AB
Carboxy THC GC/MS Conf: 57 ng/mL

## 2019-01-25 ENCOUNTER — Ambulatory Visit: Payer: BLUE CROSS/BLUE SHIELD

## 2019-01-25 ENCOUNTER — Other Ambulatory Visit: Payer: Self-pay

## 2019-01-25 DIAGNOSIS — F1290 Cannabis use, unspecified, uncomplicated: Secondary | ICD-10-CM

## 2019-01-25 DIAGNOSIS — Z3480 Encounter for supervision of other normal pregnancy, unspecified trimester: Secondary | ICD-10-CM

## 2019-01-25 NOTE — Progress Notes (Signed)
   TELEPHONE OBSTETRICS VISIT ENCOUNTER NOTE  I connected with@ on 01/25/19 at  8:40 AM EST by telephone at home and verified that I am speaking with the correct person using two identifiers.   I discussed the limitations, risks, security and privacy concerns of performing an evaluation and management service by telephone and the availability of in person appointments. I also discussed with the patient that there may be a patient responsible charge related to this service. The patient expressed understanding and agreed to proceed.  Subjective:  Wendy Warren is a 33 y.o. G3P2002 at [redacted]w[redacted]d being followed for ongoing prenatal care.  She is currently monitored for the following issues for this low-risk pregnancy and has ADHD; Supervision of other normal pregnancy, antepartum; Type A blood, Rh positive; Marijuana use; Encounter for antenatal screening for chromosomal anomalies; Family history of autism; and youngest son given up for adoption on their problem list.  Patient reports she fell onto her knees 5 days ago, still has some knee pain. Taking Tylenol and using local ice with improvement in sx. Able to work. Also had an episode 2 d ago at work with feeling lightheaded and flushed, sat down and felt better. Took BP at home that day: 144/83 before work, 130/74 and 114/64 after lying down after work. States good po hydration (1 c coffee q am, lots of milk and water). Also had several B-H contractions a few days ago. . Reports fetal movement. Denies any contractions, bleeding or leaking of fluid.   The following portions of the patient's history were reviewed and updated as appropriate: allergies, current medications, past family history, past medical history, past social history, past surgical history and problem list.   Objective:   General:  Alert, oriented and cooperative.   Mental Status: Normal mood and affect perceived. Normal judgment and thought content.  Rest of physical exam deferred due  to type of encounter  Assessment and Plan:  Pregnancy: G3P2002 at [redacted]w[redacted]d 1. Marijuana use Not addressed this visit, will do so at RV.  2. Supervision of other normal pregnancy, antepartum Encouraged fluids, monitor sx (UCs, knee pain, lightheadedness) and if sx worsen/persist to call for eval. Enc to continue local ice and po Tylenol for knee sx.  Pt states her husband wants her stop working, and she plans to do so after today. I counseled no current medical indication to stop work, but she should do what is best for her/her family.  Preterm labor symptoms and general obstetric precautions including but not limited to vaginal bleeding, contractions, leaking of fluid and fetal movement were reviewed in detail with the patient.  I discussed the assessment and treatment plan with the patient. The patient was provided an opportunity to ask questions and all were answered. The patient agreed with the plan and demonstrated an understanding of the instructions. The patient was advised to call back or seek an in-person office evaluation/go to the hospital for any urgent or concerning symptoms.  Please refer to After Visit Summary for other counseling recommendations.   I provided 9 minutes of non-face-to-face time during this encounter.  Return in about 2 weeks (around 02/08/2019) for Routine prenatal care, 36 wk labs.  Future Appointments  Date Time Provider [place]  02/09/2019  8:20 AM AC-MH PROVIDER AC-MAT None    Lora Havens, PA-C [facility]

## 2019-01-25 NOTE — Progress Notes (Signed)
Per phone call, verified I was speaking with client using 2 identifiers. Client currently at home and verbalizes able to proceed with appt. BP, temperature and weight documented were obtained at home this am by client. Taking PNV daily.. Denies international travel for self or FOB since pregnant. Next MHC RV scheduled for /15/2021 at 0800 as EGA will be 36 0/7. Client aware will have rectal / vaginal cultures at that appt. Rich Number, RN

## 2019-01-26 NOTE — L&D Delivery Note (Addendum)
Vaginal Delivery Note  Spontaneous delivery of live viable female infant from the LOA position through an intact perineum. Delivery of anterior right shoulder with gentle downward guidance followed by delivery of the left posterior shoulder with gentle upward guidance. Body followed spontaneously. Infant placed on maternal chest. Nursery present and helped with neonatal resuscitation and evaluation. Cord clamped and cut after one minute. Cord blood collected. Placenta delivered with manual removal. Intact with a 3 vessel cord.  First degree perineal laceration repaired with 2-0 vicryl. Uterus firm and below umbilicus at the end of the delivery.  Mom and baby recovering in stable condition. Sponge and needle counts were correct at the end of the delivery.  APGARS: 1 minute:6 5 minutes: 9 Weight: 7 lbs 8 oz Epidural   Adelene Idler MD [facility], [facility] 03/06/19 2:39 AM

## 2019-02-09 ENCOUNTER — Ambulatory Visit: Payer: Self-pay | Admitting: Family Medicine

## 2019-02-09 ENCOUNTER — Other Ambulatory Visit: Payer: Self-pay

## 2019-02-09 ENCOUNTER — Encounter: Payer: Self-pay | Admitting: Family Medicine

## 2019-02-09 DIAGNOSIS — Z3480 Encounter for supervision of other normal pregnancy, unspecified trimester: Secondary | ICD-10-CM

## 2019-02-09 DIAGNOSIS — F1290 Cannabis use, unspecified, uncomplicated: Secondary | ICD-10-CM

## 2019-02-09 DIAGNOSIS — F909 Attention-deficit hyperactivity disorder, unspecified type: Secondary | ICD-10-CM

## 2019-02-09 MED ORDER — PREPLUS 27-1 MG PO TABS: ORAL_TABLET | ORAL | 3 refills | Status: DC

## 2019-02-09 MED ORDER — PRENATAL VITAMIN 27-0.8 MG PO TABS: 1.0000 | ORAL_TABLET | Freq: Every day | ORAL | 0 refills | Status: AC

## 2019-02-09 NOTE — Progress Notes (Signed)
  PRENATAL VISIT NOTE  Subjective:  Wendy Warren is a 34 y.o. G3P2002 at [redacted]w[redacted]d being seen today for ongoing prenatal care.  She is currently monitored for the following issues for this high-risk pregnancy and has ADHD; Supervision of other normal pregnancy, antepartum; Type A blood, Rh positive; Marijuana use; Encounter for antenatal screening for chromosomal anomalies; Family history of autism; and youngest son given up for adoption on their problem list.    Contractions: Irregular. Vag. Bleeding: None.  Movement: Present. Denies leaking of fluid/ROM.   Has been having irregular contractions over past week, feels like strong period cramp, not consistent, last one 2 days ago. Denies loss of fluid or vaginal bleeding.    The following portions of the patient's history were reviewed and updated as appropriate: allergies, current medications, past family history, past medical history, past social history, past surgical history and problem list. Problem list updated.  Objective:   Vitals:   02/09/19 0819  BP: 128/83  Pulse: (!) 111  Temp: 97.6 F (36.4 C)  Weight: 208 lb (94.3 kg)    Fetal Status: Fetal Heart Rate (bpm): 150 Fundal Height: 36 cm Movement: Present  Presentation: Vertex  General:  Alert, oriented and cooperative. Patient is in no acute distress.  Skin: Skin is warm and dry. No rash noted.   Cardiovascular: Normal heart rate noted  Respiratory: Normal respiratory effort, no problems with respiration noted  Abdomen: Soft, gravid, appropriate for gestational age.  Pain/Pressure: Absent     Pelvic: Cervical exam performed Dilation: 1 Effacement (%): 50    Extremities: Normal range of motion.  Edema: None  Mental Status: Normal mood and affect. Normal behavior. Normal judgment and thought content.   Assessment and Plan:  Pregnancy: G3P2002 at [redacted]w[redacted]d    1. Supervision of other normal pregnancy, antepartum -GBS and GC/chlamydia swabs today. Pt requests cervix checked,  1cm dilated.  -Having irregular, cramp like contractions, last one 2 days ago. Advised to let us know or go to the hospital if they are coming regularly, very frequently or strongly.  -PHQ-9 score 4, pt states she is doing ok, tired and ready for baby. Has support.  - GBS Culture - Chlamydia/GC NAA, Confirmation - Prenatal Vit-Fe Fumarate-FA (PREPLUS) 27-1 MG TABS; Take 1 tablet daily while pregnant or breastfeeding.  Dispense: 100 tablet; Refill: 3  2. Marijuana use -Last use 6 wks ago per pt. Plan of Safe care discussed, handout given.  3. Attention deficit hyperactivity disorder (ADHD), unspecified ADHD type -Pt taking adderall less than prior, about 7.5 mg once every 1-3 days. States she is feeling ok on this lower dose.    Preterm labor symptoms and general obstetric precautions including but not limited to vaginal bleeding, contractions, leaking of fluid and fetal movement were reviewed in detail with the patient. Please refer to After Visit Summary for other counseling recommendations.  Return in about 1 week (around 02/16/2019) for routine prenatal care.  Future Appointments  Date Time Provider Department Center  02/16/2019  9:00 AM AC-MH PROVIDER AC-MAT None    Ann Held, PA-C

## 2019-02-09 NOTE — Progress Notes (Signed)
Patient here for MH RV at 36 weeks. Taking lower dose of Adderall every couple of days per patient report. 36 week packet and labs today. Patient prefers provider to collect labs. Needs more PNV, pharmacy verified. [facility] pamphlet given, needs plan of safe care information.Burt Knack, RN

## 2019-02-13 LAB — CULTURE, BETA STREP (GROUP B ONLY): Strep Gp B Culture: NEGATIVE

## 2019-02-13 LAB — CHLAMYDIA/GC NAA, CONFIRMATION
Chlamydia trachomatis, NAA: NEGATIVE
Neisseria gonorrhoeae, NAA: NEGATIVE

## 2019-02-15 ENCOUNTER — Telehealth: Payer: Self-pay | Admitting: Family Medicine

## 2019-02-15 NOTE — Telephone Encounter (Signed)
Wants to speak to nurse about pressure on her lower part

## 2019-02-16 ENCOUNTER — Other Ambulatory Visit: Payer: Self-pay

## 2019-02-16 ENCOUNTER — Encounter: Payer: Self-pay | Admitting: Family Medicine

## 2019-02-16 ENCOUNTER — Ambulatory Visit: Payer: Self-pay | Admitting: Family Medicine

## 2019-02-16 DIAGNOSIS — Z3480 Encounter for supervision of other normal pregnancy, unspecified trimester: Secondary | ICD-10-CM

## 2019-02-16 DIAGNOSIS — F909 Attention-deficit hyperactivity disorder, unspecified type: Secondary | ICD-10-CM

## 2019-02-16 DIAGNOSIS — F1290 Cannabis use, unspecified, uncomplicated: Secondary | ICD-10-CM

## 2019-02-16 NOTE — Progress Notes (Signed)
Verified speaking with client using 2 identifiers during telehealth appt this am. Client currently at home and ready to proceed with appt. Vital signs documented were taken by client this am. Taking PNV and Adderall as per medication list. Denies international travel for self or FOB since pregnant. Reports increased feelings of vaginal pressure with rare contraction. Reports positive FM as usual and denies increase in vaginal discharge, bleeding and UTI symptoms. Encouraged to discuss with provider who will call later this am to complete appt. One week MHC RV appt scheduled for 02/23/2019 with arrival time of 8:45 am. Client aware if appt needs to be changed to notify provider and RN can change this am at conclusion of telehealth appt. Jossie Ng, RN

## 2019-02-16 NOTE — Progress Notes (Signed)
   TELEPHONE OBSTETRICS VISIT ENCOUNTER NOTE  I connected with@ on 02/16/19 at  9:00 AM EST by telephone at home and verified that I am speaking with the correct person using two identifiers.   I discussed the limitations, risks, security and privacy concerns of performing an evaluation and management service by telephone and the availability of in person appointments. I also discussed with the patient that there may be a patient responsible charge related to this service. The patient expressed understanding and agreed to proceed.  Subjective:  Wendy Warren is a 34 y.o. G3P2002 at [redacted]w[redacted]d being followed for ongoing prenatal care.  She is currently monitored for the following issues for this low-risk pregnancy and has ADHD; Supervision of other normal pregnancy, antepartum; Type A blood, Rh positive; Marijuana use; Encounter for antenatal screening for chromosomal anomalies; Family history of autism; and youngest son given up for adoption on their problem list.  Is having irregular contractions, states they are not sequential nor long enough to time. Last contractions yesterday.   Has been having lower abdominal pressure in addition to infrequent contractions over the past week. More pressure when standing, feels heavy.   Reports fetal movement. Denies bleeding or leaking of fluid.   ADHD: taking adderall 7.5mg  daily. Feels good on this dose.  Marijuana use: none since early December.      The following portions of the patient's history were reviewed and updated as appropriate: allergies, current medications, past family history, past medical history, past social history, past surgical history and problem list.   Objective:   General:  Alert, oriented and cooperative.   Mental Status: Normal mood and affect perceived. Normal judgment and thought content.  Rest of physical exam deferred due to type of encounter  Today's Vitals   02/16/19 0857  BP: 127/83  Temp: 99.1 F (37.3 C)   Weight: 207 lb 6.4 oz (94.1 kg)   Body mass index is 37.93 kg/m.  Assessment and Plan:  Pregnancy: G3P2002 at [redacted]w[redacted]d   1. Supervision of other normal pregnancy, antepartum -Discussed that lower abd pressure, associated w/irregular contractions can be normal in late pregnancy. Advised to monitor, if worsens to let us know or if severe pain to go to hospital. -She prefers in person visit for next appt.   2. Marijuana use -Last use early Dec per pt. Does not intend to use in remainder of pregnancy. Encouraged in her goal.  3. Attention deficit hyperactivity disorder (ADHD), unspecified ADHD type -Continues adderall 7.5mg  every 1-3 days, feels this dose is adequate.    Term labor symptoms and general obstetric precautions including but not limited to vaginal bleeding, contractions, leaking of fluid and fetal movement were reviewed in detail with the patient.  I discussed the assessment and treatment plan with the patient. The patient was provided an opportunity to ask questions and all were answered. The patient agreed with the plan and demonstrated an understanding of the instructions. The patient was advised to call back or seek an in-person office evaluation/go to the hospital for any urgent or concerning symptoms.  Please refer to After Visit Summary for other counseling recommendations.   I provided 7 minutes of non-face-to-face time during this encounter.  Return in about 1 week (around 02/23/2019) for routine prenatal care.  Future Appointments  Date Time Provider Department Center  02/23/2019  9:00 AM AC-MH PROVIDER AC-MAT None    Ann Held, PA-C [facility]

## 2019-02-23 ENCOUNTER — Other Ambulatory Visit: Payer: Self-pay

## 2019-02-23 ENCOUNTER — Encounter: Payer: Self-pay | Admitting: Advanced Practice Midwife

## 2019-02-23 ENCOUNTER — Ambulatory Visit: Payer: Self-pay | Admitting: Advanced Practice Midwife

## 2019-02-23 VITALS — BP 139/83 | HR 96 | Temp 97.7°F | Wt 215.4 lb

## 2019-02-23 DIAGNOSIS — E669 Obesity, unspecified: Secondary | ICD-10-CM

## 2019-02-23 DIAGNOSIS — Z3480 Encounter for supervision of other normal pregnancy, unspecified trimester: Secondary | ICD-10-CM

## 2019-02-23 DIAGNOSIS — F1290 Cannabis use, unspecified, uncomplicated: Secondary | ICD-10-CM

## 2019-02-23 DIAGNOSIS — Z6839 Body mass index (BMI) 39.0-39.9, adult: Secondary | ICD-10-CM

## 2019-02-23 DIAGNOSIS — F909 Attention-deficit hyperactivity disorder, unspecified type: Secondary | ICD-10-CM

## 2019-02-23 LAB — URINALYSIS
Bilirubin, UA: NEGATIVE
Glucose, UA: NEGATIVE
Ketones, UA: NEGATIVE
Nitrite, UA: NEGATIVE
Protein,UA: NEGATIVE
Specific Gravity, UA: 1.02 (ref 1.005–1.030)
Urobilinogen, Ur: 0.2 mg/dL (ref 0.2–1.0)
pH, UA: 7 (ref 5.0–7.5)

## 2019-02-23 NOTE — Progress Notes (Addendum)
Here today for 38.0 week MH RV. Taking PNV QD, denies ED/hospital visits since last RV. Requesting to be checked today "to see if I'm dialating." Tawny Hopping, RN

## 2019-02-23 NOTE — Progress Notes (Signed)
   PRENATAL VISIT NOTE  Subjective:  Wendy Warren is a 34 y.o. G3P2002 at [redacted]w[redacted]d being seen today for ongoing prenatal care.  She is currently monitored for the following issues for this low-risk pregnancy and has ADHD; Supervision of other normal pregnancy, antepartum; Type A blood, Rh positive; Marijuana use; Encounter for antenatal screening for chromosomal anomalies; Family history of autism; and youngest son given up for adoption on their problem list.  Patient reports pelvic pressure, dizzy.  Contractions: Irregular. Vag. Bleeding: None.  Movement: Present. Denies leaking of fluid/ROM.   The following portions of the patient's history were reviewed and updated as appropriate: allergies, current medications, past family history, past medical history, past social history, past surgical history and problem list. Problem list updated.  Objective:   Vitals:   02/23/19 0908  BP: 139/83  Pulse: 96  Temp: 97.7 F (36.5 C)  Weight: 215 lb 6.4 oz (97.7 kg)    Fetal Status: Fetal Heart Rate (bpm): 150 Fundal Height: 39 cm Movement: Present     General:  Alert, oriented and cooperative. Patient is in no acute distress.  Skin: Skin is warm and dry. No rash noted.   Cardiovascular: Normal heart rate noted  Respiratory: Normal respiratory effort, no problems with respiration noted  Abdomen: Soft, gravid, appropriate for gestational age.  Pain/Pressure: Absent     Pelvic: Cervical exam performed        Extremities: Normal range of motion.  Edema: Mild pitting, slight indentation  Mental Status: Normal mood and affect. Normal behavior. Normal judgment and thought content.   Assessment and Plan:  Pregnancy: G3P2002 at [redacted]w[redacted]d  1. Supervision of other normal pregnancy, antepartum Knows when to go to L&D; has car seat BP=139/83 today and c/o h/a relieved with caffeine, c/o edema, scotoma today and miseries of pregnancy.  This FOB is first pregnancy with her.  U/A today.  Reviewed warning signs  preeclampsia and when to go to L&D IOL paperwork completed; pt requests IOL 03/16/19 BMI=39.4 and has never taken ASA this pregnancy - Urinalysis (Urine Dip)  2. Marijuana use States last MJ at 30 wks; agrees to UDS today - 119147 Drug Screen  3. Attention deficit hyperactivity disorder (ADHD), unspecified ADHD type Taking Adderrall 7.5 mg  4. Class 2 obesity with body mass index (BMI) of 39.0 to 39.9 in adult, unspecified obesity type, unspecified whether serious comorbidity present 49 lb 6.4 oz (22.4 kg)    Term labor symptoms and general obstetric precautions including but not limited to vaginal bleeding, contractions, leaking of fluid and fetal movement were reviewed in detail with the patient. Please refer to After Visit Summary for other counseling recommendations.  Return in about 1 week (around 03/02/2019) for routine PNC.  No future appointments.  Alberteen Spindle, CNM

## 2019-02-24 LAB — 789231 7+OXYCODONE-BUND
Amphetamines, Urine: NEGATIVE ng/mL
BENZODIAZ UR QL: NEGATIVE ng/mL
Barbiturate screen, urine: NEGATIVE ng/mL
Cannabinoid Quant, Ur: NEGATIVE ng/mL
Cocaine (Metab.): NEGATIVE ng/mL
OPIATE SCREEN URINE: NEGATIVE ng/mL
Oxycodone/Oxymorphone, Urine: NEGATIVE ng/mL
PCP Quant, Ur: NEGATIVE ng/mL

## 2019-02-27 ENCOUNTER — Other Ambulatory Visit: Payer: Self-pay

## 2019-02-27 ENCOUNTER — Ambulatory Visit: Payer: BC Managed Care – PPO | Admitting: Family Medicine

## 2019-02-27 ENCOUNTER — Encounter: Payer: Self-pay | Admitting: Family Medicine

## 2019-02-27 DIAGNOSIS — Z3480 Encounter for supervision of other normal pregnancy, unspecified trimester: Secondary | ICD-10-CM

## 2019-02-27 DIAGNOSIS — F1290 Cannabis use, unspecified, uncomplicated: Secondary | ICD-10-CM

## 2019-02-27 DIAGNOSIS — F909 Attention-deficit hyperactivity disorder, unspecified type: Secondary | ICD-10-CM

## 2019-02-27 NOTE — Progress Notes (Signed)
Patient here for MH RV at 38 4/7. States she was told at last visit about "membrane sweep"  And would like that done today if possible.Burt Knack, RN

## 2019-02-27 NOTE — Progress Notes (Signed)
   PRENATAL VISIT NOTE  Subjective:  Wendy Warren is a 34 y.o. G3P2002 at [redacted]w[redacted]d being seen today for ongoing prenatal care.  She is currently monitored for the following issues for this high-risk pregnancy and has ADHD; Supervision of other normal pregnancy, antepartum; Type A blood, Rh positive; Marijuana use; Encounter for antenatal screening for chromosomal anomalies; Family history of autism; youngest son given up for adoption; and Obesity BMI=39.4 on their problem list.  Swelling about same as last week. Continues w/daily HA which she attributes to cutting back on caffeine. She takes tylenol/caffeine which helps. +Scotoma every other day. 1 episode of dizziness after shower, no fainting.   Contractions: Irregular. Vag. Bleeding: None.  Movement: Present. Denies leaking of fluid/ROM.   The following portions of the patient's history were reviewed and updated as appropriate: allergies, current medications, past family history, past medical history, past social history, past surgical history and problem list. Problem list updated.  Objective:   Vitals:   02/27/19 0955  BP: 126/83  Pulse: (!) 103  Temp: 97.6 F (36.4 C)  Weight: 217 lb 9.6 oz (98.7 kg)    Fetal Status: Fetal Heart Rate (bpm): 150 Fundal Height: 40 cm Movement: Present  Presentation: Vertex  General:  Alert, oriented and cooperative. Patient is in no acute distress.  Skin: Skin is warm and dry. No rash noted.   Cardiovascular: Normal heart rate noted  Respiratory: Normal respiratory effort, no problems with respiration noted  Abdomen: Soft, gravid, appropriate for gestational age.  Pain/Pressure: Absent     Pelvic: Cervical exam performed Dilation: 3 Effacement (%): Thick Station: -3  Extremities: Normal range of motion.  Edema: Mild pitting, slight indentation  Mental Status: Normal mood and affect. Normal behavior. Normal judgment and thought content.   Assessment and Plan:  Pregnancy: G3P2002 at [redacted]w[redacted]d   1.  Supervision of other normal pregnancy, antepartum -Membrane sweep attempted by myself and Arnetha Courser, CNM, unable to sweep but cervix eventually dilated to 3cm.  -Continues with similar symptoms as last week of daily HA (which she attributes to caffeine reduction) relieved w/tylenol and/or caffeine. Also has continued swelling of hands and feet, worse at end of day, and scotoma every other day. BP today wnl. Will defer urine dip today as she had some bloody show with attempted membrane sweep which may skew UA results. Will bring back Thursday for BP check and UA. Preeclampsia precautions given, pt knows to go to hospital if present. -IOL completed last visit. Pt is ready for baby at home.  2. Marijuana use -Continues abstinent. Last use early December.   3. Attention deficit hyperactivity disorder (ADHD), unspecified ADHD type -Adderall 7.5mg  daily.   Counseled on membrane sweeping risks/benefits and alternatives. Discussed cramping, contractions, bleeding and ROM as potential side effects. Reviewed efficacy of membrane sweeping. Patient verbally consented for sweeping today. Discussed reasons to seek care before next scheduled visit in detail.     Term labor symptoms and general obstetric precautions including but not limited to vaginal bleeding, contractions, leaking of fluid and fetal movement were reviewed in detail with the patient. Please refer to After Visit Summary for other counseling recommendations.  Return in about 2 days (around 03/01/2019) for routine prenatal care.  Future Appointments  Date Time Provider Department Center  03/01/2019  1:20 PM [facility] PROVIDER [facility] None    Ann Held, PA-C

## 2019-02-28 ENCOUNTER — Other Ambulatory Visit: Payer: Self-pay

## 2019-02-28 ENCOUNTER — Observation Stay
Admission: EM | Admit: 2019-02-28 | Discharge: 2019-02-28 | Disposition: A | Discharge status: Home / Self Care | Payer: BC Managed Care – PPO | Attending: Certified Nurse Midwife | Admitting: Certified Nurse Midwife

## 2019-02-28 ENCOUNTER — Encounter: Payer: Self-pay | Admitting: Obstetrics & Gynecology

## 2019-02-28 DIAGNOSIS — Z3480 Encounter for supervision of other normal pregnancy, unspecified trimester: Secondary | ICD-10-CM

## 2019-02-28 DIAGNOSIS — Z3A38 38 weeks gestation of pregnancy: Secondary | ICD-10-CM | POA: Insufficient documentation

## 2019-02-28 DIAGNOSIS — O471 False labor at or after 37 completed weeks of gestation: Principal | ICD-10-CM | POA: Insufficient documentation

## 2019-02-28 DIAGNOSIS — Z87891 Personal history of nicotine dependence: Secondary | ICD-10-CM | POA: Insufficient documentation

## 2019-02-28 DIAGNOSIS — F1290 Cannabis use, unspecified, uncomplicated: Secondary | ICD-10-CM

## 2019-02-28 DIAGNOSIS — F909 Attention-deficit hyperactivity disorder, unspecified type: Secondary | ICD-10-CM | POA: Insufficient documentation

## 2019-02-28 LAB — COMPREHENSIVE METABOLIC PANEL
ALT: 17 U/L (ref 0–44)
AST: 20 U/L (ref 15–41)
Albumin: 2.6 g/dL — ABNORMAL LOW (ref 3.5–5.0)
Alkaline Phosphatase: 130 U/L — ABNORMAL HIGH (ref 38–126)
Anion gap: 8 (ref 5–15)
BUN: 10 mg/dL (ref 6–20)
CO2: 21 mmol/L — ABNORMAL LOW (ref 22–32)
Calcium: 9.6 mg/dL (ref 8.9–10.3)
Chloride: 104 mmol/L (ref 98–111)
Creatinine, Ser: 0.63 mg/dL (ref 0.44–1.00)
GFR calc Af Amer: >60 mL/min (ref >60)
GFR calc non Af Amer: >60 mL/min (ref >60)
Glucose, Bld: 80 mg/dL (ref 70–99)
Potassium: 4 mmol/L (ref 3.5–5.1)
Sodium: 133 mmol/L — ABNORMAL LOW (ref 135–145)
Total Bilirubin: 0.6 mg/dL (ref 0.3–1.2)
Total Protein: 5.8 g/dL — ABNORMAL LOW (ref 6.5–8.1)

## 2019-02-28 LAB — PROTEIN / CREATININE RATIO, URINE
Creatinine, Urine: 47 mg/dL
Protein Creatinine Ratio: 0.21 mg/mg{Cre} — ABNORMAL HIGH (ref 0.00–0.15)
Total Protein, Urine: 10 mg/dL

## 2019-02-28 LAB — CBC
HCT: 39.2 % (ref 36.0–46.0)
Hemoglobin: 13.7 g/dL (ref 12.0–15.0)
MCH: 33.2 pg (ref 26.0–34.0)
MCHC: 34.9 g/dL (ref 30.0–36.0)
MCV: 94.9 fL (ref 80.0–100.0)
Platelets: 222 10*3/uL (ref 150–400)
RBC: 4.13 MIL/uL (ref 3.87–5.11)
RDW: 15 % (ref 11.5–15.5)
WBC: 13.8 10*3/uL — ABNORMAL HIGH (ref 4.0–10.5)
nRBC: 0 % (ref 0.0–0.2)

## 2019-02-28 NOTE — Final Progress Note (Signed)
Physician Final Progress Note  Patient ID: Wendy Warren MRN: [id number] DOB/AGE: August 13, 1985 33 y.o.  Admit date: 02/28/2019 Admitting provider: Nadara Mustard, MD/ Gasper Lloyd. Sharen Hones, CNM Discharge date: 02/28/2019   Admission Diagnoses: contractions at 38wk5d  Discharge Diagnoses:  Early vs false labor  Consults: None  Significant Findings/ Diagnostic Studies: 34 year old G3 P2002 with EDC=03/09/2019 presents at 38wk5d with complaints of contractions that started at 0530 this AM and became stronger at 1100 and were every 6-10 minutes apart on arrival. Baby moving well. Having a small amount of brown show. Membranes were swept yesterday.  Has had some transient headaches with the pregnancy and scintillating scotomata. Blood pressures in clinic in the 130s/80s. Yesterday 126.83. first trimester blood pressure 117/72.  No headache today. Denies RUQ pain, CP, SOB.  Prenatal care at [facility] has been remarkable for ADHD (on Adderall), obesity (current BMI 39.69 kg/m2), and MJ use in early pregnancy. Last UDS 5 days ago was negative.  Her prenatal care is also remarkable for the following: Nursing Staff Provider  Office Location  [facility] Dating  LMP/13 wks  Language  English Anatomy US  WNL @ 21 3/7  Flu Vaccine  11/30/2018 Genetic Screen  MaterniT21=neg; female  TDaP vaccine   12/14/2018 Hgb A1C or  GTT Early  Third trimester WNL  Rhogam     LAB RESULTS   Feeding Plan Breast Blood Type A/Positive/-- (07/28 1138)   Contraception Fertility Awareness Method Antibody Negative (07/28 1138)  Circumcision  Rubella 1.68 (07/28 1138)  Pediatrician  [facility] RPR Non Reactive (07/28 1138)   Support Person  HBsAg Negative (07/28 1138)   Prenatal Classes  HIV Non-reactive (07/28 0000)    Varicella immune  BTL Consent  GBS  (For PCN allergy, check sensitivities)        VBAC Consent  Pap      Hgb Electro    BP Cuff ordered  CF   Delivery Group  [facility]   Centering Group      OB  History  Gravida Para Term Preterm AB Living  3 2 2  0 0 2  SAB TAB Ectopic Multiple Live Births  0 0 0 0 2    # Outcome Date GA Lbr Len/2nd Weight Sex Delivery Anes PTL Lv  3 Current           2 Term 01/2009 [redacted]w[redacted]d  3402 g M Vag-Spont  N LIV  1 Term 11/2006 [redacted]w[redacted]d  3856 g M Vag-Spont   LIV   Past Medical History:  Diagnosis Date  . ADHD (attention deficit hyperactivity disorder)    Takes Adderall  . Hx: UTI (urinary tract infection)    In past pregnancy  . Plantar fasciitis    Past Surgical History:  Procedure Laterality Date  . PLANTAR FASCIA RELEASE     Social History   Socioeconomic History  . Marital status: Married    Spouse name: [redacted]w[redacted]d  . Number of children: 1  . Years of education: 41  . Highest education level: 12th grade  Occupational History  . Not on file  Tobacco Use  . Smoking status: Former Smoker    Packs/day: 1.00    Types: Cigarettes    Quit date: 07/06/2018    Years since quitting: 0.6  . Smokeless tobacco: Never Used  Substance and Sexual Activity  . Alcohol use: Not Currently    Comment: Last ETOH 05/2018  . Drug use: Not Currently    Types: Marijuana  Comment: Last used early pregnancy  . Sexual activity: Yes    Birth control/protection: None  Other Topics Concern  . Not on file  Social History Narrative  . Not on file   Social Determinants of Health   Financial Resource Strain: Low Risk   . Difficulty of Paying Living Expenses: Not hard at all  Food Insecurity: No Food Insecurity  . Worried About Programme researcher, broadcasting/film/video in the Last Year: Never true  . Ran Out of Food in the Last Year: Never true  Transportation Needs: No Transportation Needs  . Lack of Transportation (Medical): No  . Lack of Transportation (Non-Medical): No  Physical Activity:   . Days of Exercise per Week: Not on file  . Minutes of Exercise per Session: Not on file  Stress: No Stress Concern Present  . Feeling of Stress : Only a little  Social Connections:    . Frequency of Communication with Friends and Family: Not on file  . Frequency of Social Gatherings with Friends and Family: Not on file  . Attends Religious Services: Not on file  . Active Member of Clubs or Organizations: Not on file  . Attends Banker Meetings: Not on file  . Marital Status: Not on file  Intimate Partner Violence: Not At Risk  . Fear of Current or Ex-Partner: No  . Emotionally Abused: No  . Physically Abused: No  . Sexually Abused: No   Exam: General gravid WF in NAD Vital signs:  Patient Vitals for the past 24 hrs:  BP Temp Temp src Pulse Resp Height Weight  02/28/19 1846 140/82 -- -- 85 -- -- --  02/28/19 1820 (!) 146/93 -- -- (!) 122 -- -- --  02/28/19 1805 129/76 -- -- 96 -- -- --  02/28/19 1750 129/81 -- -- 95 -- -- --  02/28/19 1735 133/88 -- -- 95 -- -- --  02/28/19 1720 135/82 -- -- 90 -- -- --  02/28/19 1705 (!) 144/86 -- -- 83 -- -- --  02/28/19 1650 137/81 -- -- 83 -- -- --  02/28/19 1634 (!) 144/82 -- -- 84 -- -- --  02/28/19 1435 138/78 98.5 F (36.9 C) Oral 94 18 5\' 2"  (1.575 m) 98.4 kg  146/93 blood pressure was when arm was bent and she was eating Respiratory: normal respiratory effort Abdomen: gravid, non tender to palpation  FHR: 140 baseline with accelerations to 160s to 170, moderate variability Toco: contractions irregular, q2-10 minutes apart  Ultrasound: cephalic (LOA)  Cervix: external os 3 cm/ internal os 1 cm?/thick/ -2/posterior-no change over 2 hours +brown show  Extremities: +1 pedal and hand edema Neuro: +3/+3 DTRS, alert, awake, CN grossly intact   Results for orders placed or performed during the hospital encounter of 02/28/19 (from the past 24 hour(s))  Protein / creatinine ratio, urine     Status: Abnormal   Collection Time: 02/28/19  4:51 PM  Result Value Ref Range   Creatinine, Urine 47 mg/dL   Total Protein, Urine 10 mg/dL   Protein Creatinine Ratio 0.21 (H) 0.00 - 0.15 mg/mg[Cre]  Comprehensive  metabolic panel     Status: Abnormal   Collection Time: 02/28/19  5:03 PM  Result Value Ref Range   Sodium 133 (L) 135 - 145 mmol/L   Potassium 4.0 3.5 - 5.1 mmol/L   Chloride 104 98 - 111 mmol/L   CO2 21 (L) 22 - 32 mmol/L   Glucose, Bld 80 70 - 99 mg/dL   BUN 10  6 - 20 mg/dL   Creatinine, Ser 0.63 0.44 - 1.00 mg/dL   Calcium 9.6 8.9 - 10.3 mg/dL   Total Protein 5.8 (L) 6.5 - 8.1 g/dL   Albumin 2.6 (L) 3.5 - 5.0 g/dL   AST 20 15 - 41 U/L   ALT 17 0 - 44 U/L   Alkaline Phosphatase 130 (H) 38 - 126 U/L   Total Bilirubin 0.6 0.3 - 1.2 mg/dL   GFR calc non Af Amer >60 >60 mL/min   GFR calc Af Amer >60 >60 mL/min   Anion gap 8 5 - 15  CBC     Status: Abnormal   Collection Time: 02/28/19  5:03 PM  Result Value Ref Range   WBC 13.8 (H) 4.0 - 10.5 K/uL   RBC 4.13 3.87 - 5.11 MIL/uL   Hemoglobin 13.7 12.0 - 15.0 g/dL   HCT 39.2 36.0 - 46.0 %   MCV 94.9 80.0 - 100.0 fL   MCH 33.2 26.0 - 34.0 pg   MCHC 34.9 30.0 - 36.0 g/dL   RDW 15.0 11.5 - 15.5 %   Platelets 222 150 - 400 K/uL   nRBC 0.0 0.0 - 0.2 %   A: IUP at 38wk5d with false vs early labor Elevated blood pressures-R/O gestational hypertension Reactive NST/ Cat 1 tracing  P: Discharge home with labor precautions RTN to L&D tomorrow for blood pressure check-if blood pressures elevated-will have diagnosis of gestational hypertension and may need induction of labor  Procedures: none  Discharge Condition: stable  Disposition: Discharge disposition: 01-Home or Self Care       Diet: Regular diet  Discharge Activity: Activity as tolerated  Discharge Instructions    Discharge patient   Complete by: As directed    Discharge disposition: 01-Home or Self Care   Discharge patient date: 02/28/2019     Allergies as of 02/28/2019      Reactions   Morphine And Related Other (See Comments)   Palpitations, facial numbness      Medication List    TAKE these medications   amphetamine-dextroamphetamine 15 MG tablet Commonly  known as: ADDERALL Take 15 mg by mouth daily.   Prenatal Vitamin 27-0.8 MG Tabs Take 1 tablet by mouth daily.        Total time spent taking care of this patient: 20 minutes  Signed: Dalia Heading 02/28/2019, 4:37 PM

## 2019-02-28 NOTE — OB Triage Note (Signed)
Pt is a 33yo G3P2 at [redacted]w[redacted]d that presents from ED with c/o ctx since 0530 this am and are every 6-10 minutes. Pain with ctx is reported 9/10 and are located in abdomen. Pt denies VB, LOF and states positive FM. Monitors applied and initial fht 155. VSS will continue to monitor.

## 2019-02-28 NOTE — OB Triage Note (Signed)
Order received for Discharge. Discharge instructions reviewed with the patient and patient verbalized understanding. Pt to follow up with Health department tomorrow. Pt instructed to return to hospital if needed for signs of labor. Pt discharged with husband in stable condition.

## 2019-02-28 NOTE — Progress Notes (Signed)
RN at bedside with CNM to evaluate elevated BP taken before discharge. Orders given for PC ratio and CMP, CBC. Will continue to cycle BP q15 min. Will continue to monitor.

## 2019-02-28 NOTE — OB Triage Note (Signed)
Discharge instructions reviewed with patient and patient verbalized understanding of instructions. Pt instructed to return tomorrow for NST and BP check. Pt has no further questions at this time and discharged with significant other.

## 2019-02-28 NOTE — Discharge Instructions (Signed)
First Stage of Labor °Labor is your body's natural process of moving your baby and other structures, including the placenta and umbilical cord, out of your uterus. There are three stages of labor. How long each stage lasts is different for every woman. But certain events happen during each stage that are the same for everyone. °· The first stage starts when true labor begins. This stage ends when your cervix, which is the opening from your uterus into your vagina, is completely open (dilated). °· The second stage begins when your cervix is fully dilated and you start pushing. This stage ends when your baby is born. °· The third stage is the delivery of the organ that nourished your baby during pregnancy (placenta). °First stage of labor °As your due date gets closer, you may start to notice certain physical changes that mean labor is going to start soon. You may feel that your baby has dropped lower into your pelvis. You may experience irregular, often painless, contractions that go away when you walk around or lie down (Braxton Hicks contractions). This is also called false labor. °The first stage of labor begins when you start having contractions that come at regular (evenly spaced) intervals and your cervix starts to get thinner and wider in preparation for your baby to pass through. Birth care providers measure the dilation of your cervix in centimeters (cm). One centimeter is a little less than one-half of an inch. The first stage ends when your cervix is dilated to 10 cm. The first stage of labor is divided into three phases: °· Early phase. °· Active phase. °· Transitional phase. °The length of the first stage of labor varies. It may be longer if this is your first pregnancy. You may spend most of this stage at home trying to relax and stay comfortable. °How does this affect me? °During the first stage of labor, you will move through three phases. °What happens in the early phase? °· You will start to have  regular contractions that last 30-60 seconds. Contractions may come every 5-20 minutes. Keep track of your contractions and call your birth care provider. °· Your water may break during this phase. °· You may notice a clear or slightly bloody discharge of mucus (mucus plug) from your vagina. °· Your cervix will dilate to 3-6 cm. °What happens in the active phase? °The active phase usually lasts 3-5 hours. You may go to the hospital or birth center around this time. During the active phase: °· Your contractions will become stronger, longer, and more uncomfortable. °· Your contractions may last 45-90 seconds and come every 3-5 minutes. °· You may feel lower back pain. °· Your birth care providers may examine your cervix and feel your belly to find the position of your baby. °· You may have a monitor strapped to your belly to measure your contractions and your baby's heart rate. °· You may start using your pain management options. °· Your cervix may be dilated to 6 cm and may start to dilate more quickly. °What happens in the transitional phase? °The transitional phase typically lasts from 30 minutes to 2 hours. At the end of this phase, your cervix will be fully dilated to 10 cm. During the transitional phase: °· Contractions will get stronger and longer. °· Contractions may last 60-90 seconds and come less than 2 minutes apart. °· You may feel hot flashes, chills, or nausea. °How does this affect my baby? °During the first stage of labor, your baby will   gradually move down into your birth canal. °Follow these instructions at home and in the hospital or birth center: ° °· When labor first begins, try to stay calm. You are still in the early phase. If it is night, try to get some sleep. If it is day, try to relax and save your energy. You may want to make some calls and get ready to go to the hospital or birth center. °· When you are in the early phase, try these methods to help ease discomfort: °? Deep breathing and  muscle relaxation. °? Taking a walk. °? Taking a warm bath or shower. °· Drink some fluids and have a light snack if you feel like it. °· Keep track of your contractions. °· Based on the plan you created with your birth care provider, call when your contractions indicate it is time. °· If your water breaks, note the time, color, and odor of the fluid. °· When you are in the active phase, do your breathing exercises and rely on your support people and your team of birth care providers. °Contact a health care provider if: °· Your contractions are strong and regular. °· You have lower back pain or cramping. °· Your water breaks. °· You lose your mucus plug. °Get help right away if you: °· Have a severe headache that does not go away. °· Have changes in your vision. °· Have severe pain in your upper belly. °· Do not feel the baby move. °· Have bright red bleeding. °Summary °· The first stage of labor starts when true labor begins, and it ends when your cervix is dilated to 10 cm. °· The first stage of labor has three phases: early, active, and transitional. °· Your baby moves into the birth canal during the first stage of labor. °· You may have contractions that become stronger and longer. You may also lose your mucus plug and have your water break. °· Call your birth care provider when your contractions are frequent and strong enough to go to the hospital or birth center. °This information is not intended to replace advice given to you by your health care provider. Make sure you discuss any questions you have with your health care provider. °Document Revised: 05/04/2018 Document Reviewed: 03/27/2017 °Elsevier Patient Education © 2020 [employer] ° °

## 2019-03-01 ENCOUNTER — Observation Stay
Admission: RE | Admit: 2019-03-01 | Discharge: 2019-03-01 | Disposition: A | Discharge status: Home / Self Care | Payer: BLUE CROSS/BLUE SHIELD | Attending: Obstetrics and Gynecology | Admitting: Obstetrics and Gynecology

## 2019-03-01 ENCOUNTER — Ambulatory Visit: Payer: BC Managed Care – PPO

## 2019-03-01 DIAGNOSIS — Z3A38 38 weeks gestation of pregnancy: Secondary | ICD-10-CM | POA: Insufficient documentation

## 2019-03-01 DIAGNOSIS — Z885 Allergy status to narcotic agent status: Secondary | ICD-10-CM | POA: Insufficient documentation

## 2019-03-01 DIAGNOSIS — Z20822 Contact with and (suspected) exposure to covid-19: Secondary | ICD-10-CM | POA: Insufficient documentation

## 2019-03-01 LAB — SARS CORONAVIRUS 2 (TAT 6-24 HRS): SARS Coronavirus 2: NEGATIVE

## 2019-03-01 NOTE — OB Triage Note (Signed)
Here for BP evaluation

## 2019-03-01 NOTE — OB Triage Note (Signed)
Covid swab obtained. Pt discharged home with instructions to return Monday at 8 am for IOL. Pre eclampsia warning signs discussed and patient verbalized understanding of when to return.

## 2019-03-01 NOTE — Discharge Summary (Signed)
Physician Discharge Summary   Patient ID: Wendy Warren [id number] 33 y.o. 06/30/1985  Admit date: 03/01/2019  Discharge date and time: No discharge date for patient encounter.   Admitting Physician: Natale Milch, MD   Discharge Physician: Adelene Idler MD  Admission Diagnoses: Indication for care in labor or delivery [O75.9]  Discharge Diagnoses: Normotensive, [redacted] weeks pregnant  Admission Condition: good  Discharged Condition: good  Indication for Admission: Follow up BP reading  Hospital Course: BP reading normal. Planned IOL for Monday.   Consults: None  Significant Diagnostic Studies: None  Treatments: None  Discharge Exam: BP 119/78   Pulse 99   Resp 16   LMP 06/02/2018 (Exact Date)   General Appearance:    Alert, cooperative, no distress, appears stated age  Head:    Normocephalic, without obvious abnormality, atraumatic  Eyes:    PERRL, conjunctiva/corneas clear, EOM's intact, fundi    benign, both eyes  Ears:    Normal TM's and external ear canals, both ears  Nose:   Nares normal, septum midline, mucosa normal, no drainage    or sinus tenderness  Throat:   Lips, mucosa, and tongue normal; teeth and gums normal  Neck:   Supple, symmetrical, trachea midline, no adenopathy;    thyroid:  no enlargement/tenderness/nodules; no carotid   bruit or JVD  Back:     Symmetric, no curvature, ROM normal, no CVA tenderness  Lungs:     Clear to auscultation bilaterally, respirations unlabored  Chest Wall:    No tenderness or deformity   Heart:    Regular rate and rhythm, S1 and S2 normal, no murmur, rub   or gallop  Breast Exam:    No tenderness, masses, or nipple abnormality  Abdomen:     Soft, non-tender, bowel sounds active all four quadrants,    no masses, no organomegaly  Genitalia:    Normal female without lesion, discharge or tenderness  Rectal:    Normal tone, normal prostate, no masses or tenderness;   guaiac negative stool  Extremities:    Extremities normal, atraumatic, no cyanosis or edema  Pulses:   2+ and symmetric all extremities  Skin:   Skin color, texture, turgor normal, no rashes or lesions  Lymph nodes:   Cervical, supraclavicular, and axillary nodes normal  Neurologic:   CNII-XII intact, normal strength, sensation and reflexes    throughout    Disposition: Discharge disposition: 01-Home or Self Care       Patient Instructions:  Allergies as of 03/01/2019      Reactions   Morphine And Related Other (See Comments)   Palpitations, facial numbness      Medication List    TAKE these medications   amphetamine-dextroamphetamine 15 MG tablet Commonly known as: ADDERALL Take 15 mg by mouth daily.   Prenatal Vitamin 27-0.8 MG Tabs Take 1 tablet by mouth daily.      Activity: activity as tolerated Diet: regular diet Wound Care: none needed  Follow-up with L&D on Monday 8am for IOL.   Signed: Natale Milch 03/01/2019 11:39 AM

## 2019-03-05 ENCOUNTER — Inpatient Hospital Stay: Payer: Self-pay | Admitting: Anesthesiology

## 2019-03-05 ENCOUNTER — Inpatient Hospital Stay
Admission: RE | Admit: 2019-03-05 | Discharge: 2019-03-07 | DRG: 807 | Disposition: A | Discharge status: Home / Self Care | Payer: Self-pay | Attending: Obstetrics and Gynecology | Admitting: Obstetrics and Gynecology

## 2019-03-05 ENCOUNTER — Other Ambulatory Visit: Payer: Self-pay

## 2019-03-05 ENCOUNTER — Encounter: Payer: Self-pay | Admitting: Obstetrics and Gynecology

## 2019-03-05 DIAGNOSIS — Z3A39 39 weeks gestation of pregnancy: Secondary | ICD-10-CM

## 2019-03-05 DIAGNOSIS — F1290 Cannabis use, unspecified, uncomplicated: Secondary | ICD-10-CM

## 2019-03-05 DIAGNOSIS — Z87891 Personal history of nicotine dependence: Secondary | ICD-10-CM

## 2019-03-05 DIAGNOSIS — Z3480 Encounter for supervision of other normal pregnancy, unspecified trimester: Secondary | ICD-10-CM

## 2019-03-05 DIAGNOSIS — Z3490 Encounter for supervision of normal pregnancy, unspecified, unspecified trimester: Secondary | ICD-10-CM

## 2019-03-05 DIAGNOSIS — O99214 Obesity complicating childbirth: Principal | ICD-10-CM | POA: Diagnosis present

## 2019-03-05 LAB — URINE DRUG SCREEN, QUALITATIVE (ARMC ONLY)
Amphetamines, Ur Screen: NOT DETECTED
Barbiturates, Ur Screen: NOT DETECTED
Benzodiazepine, Ur Scrn: NOT DETECTED
Cannabinoid 50 Ng, Ur ~~LOC~~: NOT DETECTED
Cocaine Metabolite,Ur ~~LOC~~: NOT DETECTED
MDMA (Ecstasy)Ur Screen: NOT DETECTED
Methadone Scn, Ur: NOT DETECTED
Opiate, Ur Screen: NOT DETECTED
Phencyclidine (PCP) Ur S: NOT DETECTED
Tricyclic, Ur Screen: NOT DETECTED

## 2019-03-05 LAB — TYPE AND SCREEN
ABO/RH(D): A POS
Antibody Screen: NEGATIVE

## 2019-03-05 LAB — RAPID HIV SCREEN (HIV 1/2 AB+AG)
HIV 1/2 Antibodies: NONREACTIVE
HIV-1 P24 Antigen - HIV24: NONREACTIVE

## 2019-03-05 LAB — CBC
HCT: 39.2 % (ref 36.0–46.0)
Hemoglobin: 13.5 g/dL (ref 12.0–15.0)
MCH: 32.5 pg (ref 26.0–34.0)
MCHC: 34.4 g/dL (ref 30.0–36.0)
MCV: 94.5 fL (ref 80.0–100.0)
Platelets: 213 10*3/uL (ref 150–400)
RBC: 4.15 MIL/uL (ref 3.87–5.11)
RDW: 14.7 % (ref 11.5–15.5)
WBC: 14.7 10*3/uL — ABNORMAL HIGH (ref 4.0–10.5)
nRBC: 0 % (ref 0.0–0.2)

## 2019-03-05 LAB — ABO/RH: ABO/RH(D): A POS

## 2019-03-05 MED ORDER — LIDOCAINE HCL (PF) 1 % IJ SOLN
INTRAMUSCULAR | Status: AC
  Filled 2019-03-05: qty 30

## 2019-03-05 MED ORDER — EPHEDRINE 5 MG/ML INJ
10.0000 mg | INTRAVENOUS | Status: DC | PRN
  Filled 2019-03-05: qty 2

## 2019-03-05 MED ORDER — AMMONIA AROMATIC IN INHA
RESPIRATORY_TRACT | Status: AC
  Filled 2019-03-05: qty 10

## 2019-03-05 MED ORDER — OXYTOCIN 40 UNITS IN NORMAL SALINE INFUSION - SIMPLE MED: 2.5000 [IU]/h | INTRAVENOUS | Status: DC

## 2019-03-05 MED ORDER — TERBUTALINE SULFATE 1 MG/ML IJ SOLN: 0.2500 mg | Freq: Once | INTRAMUSCULAR | Status: DC | PRN

## 2019-03-05 MED ORDER — DIPHENHYDRAMINE HCL 50 MG/ML IJ SOLN: 12.5000 mg | INTRAMUSCULAR | Status: DC | PRN

## 2019-03-05 MED ORDER — MISOPROSTOL 100 MCG PO TABS
25.0000 ug | ORAL_TABLET | ORAL | Status: DC | PRN
  Filled 2019-03-05: qty 1

## 2019-03-05 MED ORDER — LIDOCAINE-EPINEPHRINE (PF) 1.5 %-1:200000 IJ SOLN
INTRAMUSCULAR | Status: DC | PRN
  Administered 2019-03-05: 3 mL via EPIDURAL

## 2019-03-05 MED ORDER — LACTATED RINGERS IV SOLN: INTRAVENOUS | Status: DC

## 2019-03-05 MED ORDER — LIDOCAINE HCL (PF) 1 % IJ SOLN
30.0000 mL | INTRAMUSCULAR | Status: AC | PRN
  Administered 2019-03-05: 1 mL via SUBCUTANEOUS

## 2019-03-05 MED ORDER — PHENYLEPHRINE 40 MCG/ML (10ML) SYRINGE FOR IV PUSH (FOR BLOOD PRESSURE SUPPORT)
80.0000 ug | PREFILLED_SYRINGE | INTRAVENOUS | Status: DC | PRN
  Filled 2019-03-05: qty 10

## 2019-03-05 MED ORDER — ONDANSETRON HCL 4 MG/2ML IJ SOLN
4.0000 mg | Freq: Four times a day (QID) | INTRAMUSCULAR | Status: DC | PRN
  Administered 2019-03-05: 20:00:00 4 mg via INTRAVENOUS
  Filled 2019-03-05 (×2): qty 2

## 2019-03-05 MED ORDER — SODIUM CHLORIDE 0.9 % IV SOLN
INTRAVENOUS | Status: DC | PRN
  Administered 2019-03-05 (×2): 5 mL via EPIDURAL

## 2019-03-05 MED ORDER — OXYTOCIN 40 UNITS IN NORMAL SALINE INFUSION - SIMPLE MED
INTRAVENOUS | Status: AC
  Administered 2019-03-06: 02:00:00 500 mL via INTRAVENOUS
  Filled 2019-03-05: qty 1000

## 2019-03-05 MED ORDER — LACTATED RINGERS IV SOLN
500.0000 mL | Freq: Once | INTRAVENOUS | Status: AC
  Administered 2019-03-05: 18:00:00 500 mL via INTRAVENOUS

## 2019-03-05 MED ORDER — ACETAMINOPHEN 325 MG PO TABS
650.0000 mg | ORAL_TABLET | ORAL | Status: DC | PRN
  Administered 2019-03-05: 16:00:00 650 mg via ORAL
  Filled 2019-03-05: qty 2

## 2019-03-05 MED ORDER — SOD CITRATE-CITRIC ACID 500-334 MG/5ML PO SOLN: 30.0000 mL | ORAL | Status: DC | PRN

## 2019-03-05 MED ORDER — OXYTOCIN 10 UNIT/ML IJ SOLN
INTRAMUSCULAR | Status: AC
  Filled 2019-03-05: qty 2

## 2019-03-05 MED ORDER — OXYTOCIN 40 UNITS IN NORMAL SALINE INFUSION - SIMPLE MED
1.0000 m[IU]/min | INTRAVENOUS | Status: DC
  Administered 2019-03-05: 10:00:00 2 m[IU]/min via INTRAVENOUS

## 2019-03-05 MED ORDER — FENTANYL 2.5 MCG/ML W/ROPIVACAINE 0.15% IN NS 100 ML EPIDURAL (ARMC)
12.0000 mL/h | EPIDURAL | Status: DC
  Administered 2019-03-05: 12 mL/h via EPIDURAL
  Filled 2019-03-05: qty 100

## 2019-03-05 MED ORDER — MISOPROSTOL 200 MCG PO TABS
ORAL_TABLET | ORAL | Status: AC
  Filled 2019-03-05: qty 4

## 2019-03-05 MED ORDER — LACTATED RINGERS IV SOLN: 500.0000 mL | INTRAVENOUS | Status: DC | PRN

## 2019-03-05 MED ORDER — OXYTOCIN BOLUS FROM INFUSION: 500.0000 mL | Freq: Once | INTRAVENOUS | Status: AC

## 2019-03-05 MED ORDER — FENTANYL 2.5 MCG/ML W/ROPIVACAINE 0.15% IN NS 100 ML EPIDURAL (ARMC)
EPIDURAL | Status: AC
  Filled 2019-03-05: qty 100

## 2019-03-05 NOTE — Anesthesia Preprocedure Evaluation (Signed)
Anesthesia Evaluation  Patient identified by MRN, date of birth, ID band Patient awake    Reviewed: Allergy & Precautions, H&P , NPO status , Patient's Chart, lab work & pertinent test results  History of Anesthesia Complications Negative for: history of anesthetic complications  Airway Mallampati: III  TM Distance: >3 FB Neck ROM: full    Dental  (+) Chipped   Pulmonary neg pulmonary ROS, former smoker,           Cardiovascular Exercise Tolerance: Good (-) hypertensionnegative cardio ROS       Neuro/Psych    GI/Hepatic negative GI ROS,   Endo/Other  Morbid obesity  Renal/GU   negative genitourinary   Musculoskeletal   Abdominal   Peds  Hematology negative hematology ROS (+)   Anesthesia Other Findings Past Medical History: No date: ADHD (attention deficit hyperactivity disorder)     Comment:  Takes Adderall No date: Hx: UTI (urinary tract infection)     Comment:  In past pregnancy No date: Plantar fasciitis  Past Surgical History: No date: PLANTAR FASCIA RELEASE       Reproductive/Obstetrics (+) Pregnancy                             Anesthesia Physical Anesthesia Plan  ASA: III  Anesthesia Plan: Epidural   Post-op Pain Management:    Induction:   PONV Risk Score and Plan:   Airway Management Planned:   Additional Equipment:   Intra-op Plan:   Post-operative Plan:   Informed Consent: I have reviewed the patients History and Physical, chart, labs and discussed the procedure including the risks, benefits and alternatives for the proposed anesthesia with the patient or authorized representative who has indicated his/her understanding and acceptance.       Plan Discussed with: Anesthesiologist  Anesthesia Plan Comments:         Anesthesia Quick Evaluation

## 2019-03-05 NOTE — Progress Notes (Addendum)
SVE: 3/50/-3 AROM, clear, copious fluid, uncomplicated NST: 135 bpm baseline, moderate variability, 15x15 accelerations, no decelerations. Tocometer : 2-3 minutes Patient requesting Epidural Continue with IOL- pitocin  Adelene Idler MD [facility], [facility] 03/05/2019 6:15 PM

## 2019-03-05 NOTE — H&P (Signed)
H&P  Wendy Warren is an 34 y.o. female.  HPI: She is feeling well. Feeling stronger contractions every few minutes. They have been going on for two days. She is feeling normal fetal movement. No leakage of fluid. No vaginal bleeding.   Pregnant Problems (from 08/21/18 to present)    Problem Noted Resolved   Marijuana use 08/29/2018 by Tessa Lerner, NP No   Overview Addendum 02/16/2019  9:34 AM by Ann Held, PA-C    UDS 08/22/18 pos cannabinoids Plan to refer to [facility] for services - declined [x ] give Plan of Safe Care doc- 02/09/19 [x]  3rd trim UDS done 01/11/19 = POS for cannabinoids As of 02/16/19: last use early Dec 2020.      ADHD 08/22/2018 by 08/24/2018, NP No   Overview Signed 10/18/2018  1:37 PM by 10/20/2018, CNM    Taking Adderall FOB has ADHD and Bipolar      Supervision of other normal pregnancy, antepartum 08/22/2018 by 08/24/2018, NP No   Overview Addendum 02/28/2019  5:20 PM by 04/28/2019, CNM     Nursing Staff Provider  Office Location  [facility] Dating  LMP/13 wks  Language  English Anatomy Farrel Conners  WNL @ 21 3/7  Flu Vaccine   11/30/2018 Genetic Screen  MaterniT21=neg; female  TDaP vaccine   12/14/2018 Hgb A1C or  GTT Early  Third trimester WNL  Rhogam   not needed   LAB RESULTS   Feeding Plan Breast Blood Type A/Positive/-- (07/28 1138)   Contraception Fertility Awareness Method Antibody Negative (07/28 1138)  Circumcision  Rubella 1.68 (07/28 1138)  Pediatrician  [facility] RPR Non Reactive (07/28 1138)   Support Person  HBsAg Negative (07/28 1138)   Prenatal Classes  HIV Non-reactive (07/28 0000)    Varicella immune  BTL Consent  GBS  (For PCN allergy, check sensitivities)        VBAC Consent  Pap      Hgb Electro    BP Cuff ordered  CF   Delivery Group  [facility]   [facility]               Past Medical History:  Diagnosis Date  . ADHD (attention deficit hyperactivity disorder)    Takes  Adderall  . Hx: UTI (urinary tract infection)    In past pregnancy  . Plantar fasciitis     Past Surgical History:  Procedure Laterality Date  . PLANTAR FASCIA RELEASE      Family History  Problem Relation Age of Onset  . Hypertension Mother   . Diabetes Mother   . Lung cancer Maternal Grandmother   . COPD Maternal Grandmother     Social History:  reports that she quit smoking about 7 months ago. Her smoking use included cigarettes. She smoked 1.00 pack per day. She has never used smokeless tobacco. She reports previous alcohol use. She reports previous drug use. Drug: Marijuana.  Allergies:  Allergies  Allergen Reactions  . Morphine And Related Other (See Comments)    Palpitations, facial numbness    Medications: I have reviewed the patient's current medications.  No results found for this or any previous visit (from the past 48 hour(s)).  No results found.  Review of Systems  Constitutional: Negative for chills and fever.  HENT: Negative for congestion, hearing loss and sinus pain.   Respiratory: Negative for cough, shortness of breath and wheezing.   Cardiovascular: Negative for chest pain, palpitations and  leg swelling.  Gastrointestinal: Negative for abdominal pain, constipation, diarrhea, nausea and vomiting.  Genitourinary: Negative for dysuria, flank pain, frequency and hematuria.  Musculoskeletal: Negative for back pain.  Skin: Negative for rash.  Neurological: Negative for dizziness and headaches.  Psychiatric/Behavioral: Negative for suicidal ideas. The patient is not nervous/anxious.    Blood pressure 125/81, pulse (!) 103, temperature 98.2 F (36.8 C), temperature source Oral, resp. rate 16, height 5\' 2"  (1.575 m), weight 98.4 kg, last menstrual period 06/02/2018. Physical Exam  Nursing note and vitals reviewed. Constitutional: She is oriented to person, place, and time. She appears well-developed and well-nourished.  HENT:  Head: Normocephalic and  atraumatic.  Cardiovascular: Normal rate and regular rhythm.  Respiratory: Effort normal and breath sounds normal.  GI: Soft. Bowel sounds are normal.  Musculoskeletal:        General: Normal range of motion.  Neurological: She is alert and oriented to person, place, and time.  Skin: Skin is warm and dry.  Psychiatric: She has a normal mood and affect. Her behavior is normal. Judgment and thought content normal.   NST: 140 bpm baseline, moderate variability, 15x15 accelerations, none decelerations. Tocometer : every 2-6 mintues  SVE: 1/50/-3  Assessment/Plan: 34 yo G3P2002 [redacted]w[redacted]d here for elective IOL at term in multiparous patient with favorable cervux 1. Will proceed with IOL- will start with pitocin. 2. Epidural PRN 3. GBS negative 4. Clear liquid diet.   Katharin Schneider R Kaysa Roulhac 03/05/2019, 9:23 AM

## 2019-03-05 NOTE — Anesthesia Procedure Notes (Signed)
Epidural Patient location during procedure: OB Start time: 03/05/2019 6:02 PM End time: 03/05/2019 6:05 PM  Staffing Anesthesiologist: Arnet Hofferber, Cleda Mccreedy, MD Performed: anesthesiologist   Preanesthetic Checklist Completed: patient identified, IV checked, site marked, risks and benefits discussed, surgical consent, monitors and equipment checked, pre-op evaluation and timeout performed  Epidural Patient position: sitting Prep: ChloraPrep Patient monitoring: heart rate, continuous pulse ox and blood pressure Approach: midline Location: L3-L4 Injection technique: LOR saline  Needle:  Needle type: Tuohy  Needle gauge: 17 G Needle length: 9 cm and 9 Needle insertion depth: 6.5 cm Catheter type: closed end flexible Catheter size: 19 Gauge Catheter at skin depth: 12 cm Test dose: negative and 1.5% lidocaine with Epi 1:200 K  Assessment Sensory level: T10 Events: blood not aspirated, injection not painful, no injection resistance, no paresthesia and negative IV test  Additional Notes 1 attempt Pt. Evaluated and documentation done after procedure finished. Patient identified. Risks/Benefits/Options discussed with patient including but not limited to bleeding, infection, nerve damage, paralysis, failed block, incomplete pain control, headache, blood pressure changes, nausea, vomiting, reactions to medication both or allergic, itching and postpartum back pain. Confirmed with bedside nurse the patient's most recent platelet count. Confirmed with patient that they are not currently taking any anticoagulation, have any bleeding history or any family history of bleeding disorders. Patient expressed understanding and wished to proceed. All questions were answered. Sterile technique was used throughout the entire procedure. Please see nursing notes for vital signs. Test dose was given through epidural catheter and negative prior to continuing to dose epidural or start infusion. Warning signs of  high block given to the patient including shortness of breath, tingling/numbness in hands, complete motor block, or any concerning symptoms with instructions to call for help. Patient was given instructions on fall risk and not to get out of bed. All questions and concerns addressed with instructions to call with any issues or inadequate analgesia.   Patient tolerated the insertion well without immediate complications.Reason for block:procedure for pain

## 2019-03-05 NOTE — Progress Notes (Addendum)
  Labor Progress Note   34 y.o. year old G3P2002 at [redacted]w[redacted]d weeks gestation admitted for IOL  Subjective:  Patient comfortable in bed with epidural.   Objective:   Vitals:   03/05/19 1905 03/05/19 2050  BP: 123/69 126/69  Pulse: 94 91  Resp: 17   Temp: 97.8 F (36.6 C)   SpO2: 95% 95%    Abd: non-tender Extr: no edema Perineum: moderate bright red vaginal bleeding with dark clots.   EFM: FHR: 160 bpm, variability: moderate,  accelerations:absent,  decelerations: occurring after the nadir of the contraction. Improving with discontinuation of pitocin Toco: Frequency: every 2-3 minutes   Assessment & Plan:  33 y.o. year old G3P2002 at [redacted]w[redacted]d weeks gestation admitted for IOL Pregnancy and Labor/Delivery Management  1. Pain management:  2. FWB: FHT category 2, IUPC placed easily without issue.  3. ID: GBS Negative 4. Labor management: Continued expectant management.  5. Vaginal bleeding, bloody show versus placental. Will observe closely. Fetal status reassuring.   All discussed with patient, see orders  Adelene Idler MD [facility], [facility] 03/05/2019 9:54 PM

## 2019-03-06 DIAGNOSIS — Z3A39 39 weeks gestation of pregnancy: Secondary | ICD-10-CM

## 2019-03-06 LAB — CBC
HCT: 36.5 % (ref 36.0–46.0)
Hemoglobin: 12.3 g/dL (ref 12.0–15.0)
MCH: 31.9 pg (ref 26.0–34.0)
MCHC: 33.7 g/dL (ref 30.0–36.0)
MCV: 94.8 fL (ref 80.0–100.0)
Platelets: 203 10*3/uL (ref 150–400)
RBC: 3.85 MIL/uL — ABNORMAL LOW (ref 3.87–5.11)
RDW: 14.9 % (ref 11.5–15.5)
WBC: 17.4 10*3/uL — ABNORMAL HIGH (ref 4.0–10.5)
nRBC: 0 % (ref 0.0–0.2)

## 2019-03-06 LAB — RPR: RPR Ser Ql: NONREACTIVE

## 2019-03-06 MED ORDER — OXYTOCIN 40 UNITS IN NORMAL SALINE INFUSION - SIMPLE MED
2.5000 [IU]/h | INTRAVENOUS | Status: DC
  Administered 2019-03-06: 2.5 [IU]/h via INTRAVENOUS
  Filled 2019-03-06: qty 1000

## 2019-03-06 MED ORDER — ACETAMINOPHEN 500 MG PO TABS
1000.0000 mg | ORAL_TABLET | Freq: Four times a day (QID) | ORAL | Status: DC
  Administered 2019-03-06 – 2019-03-07 (×5): 1000 mg via ORAL
  Filled 2019-03-06 (×5): qty 2

## 2019-03-06 MED ORDER — WITCH HAZEL-GLYCERIN EX PADS: 1.0000 "application " | MEDICATED_PAD | CUTANEOUS | Status: DC | PRN

## 2019-03-06 MED ORDER — SIMETHICONE 80 MG PO CHEW: 80.0000 mg | CHEWABLE_TABLET | ORAL | Status: DC | PRN

## 2019-03-06 MED ORDER — DOCUSATE SODIUM 100 MG PO CAPS
100.0000 mg | ORAL_CAPSULE | Freq: Two times a day (BID) | ORAL | Status: DC
  Administered 2019-03-06 – 2019-03-07 (×2): 100 mg via ORAL
  Filled 2019-03-06 (×2): qty 1

## 2019-03-06 MED ORDER — ZOLPIDEM TARTRATE 5 MG PO TABS: 5.0000 mg | ORAL_TABLET | Freq: Every evening | ORAL | Status: DC | PRN

## 2019-03-06 MED ORDER — BENZOCAINE-MENTHOL 20-0.5 % EX AERO
1.0000 "application " | INHALATION_SPRAY | CUTANEOUS | Status: DC | PRN
  Administered 2019-03-06: 1 via TOPICAL
  Filled 2019-03-06: qty 56

## 2019-03-06 MED ORDER — DIPHENHYDRAMINE HCL 25 MG PO CAPS: 25.0000 mg | ORAL_CAPSULE | Freq: Four times a day (QID) | ORAL | Status: DC | PRN

## 2019-03-06 MED ORDER — CEFAZOLIN SODIUM-DEXTROSE 2-4 GM/100ML-% IV SOLN
2.0000 g | Freq: Once | INTRAVENOUS | Status: AC
  Administered 2019-03-06: 03:00:00 2 g via INTRAVENOUS
  Filled 2019-03-06: qty 100

## 2019-03-06 MED ORDER — ONDANSETRON HCL 4 MG/2ML IJ SOLN: 4.0000 mg | INTRAMUSCULAR | Status: DC | PRN

## 2019-03-06 MED ORDER — IBUPROFEN 600 MG PO TABS
600.0000 mg | ORAL_TABLET | Freq: Four times a day (QID) | ORAL | Status: DC
  Administered 2019-03-06 – 2019-03-07 (×5): 600 mg via ORAL
  Filled 2019-03-06 (×5): qty 1

## 2019-03-06 MED ORDER — METHYLERGONOVINE MALEATE 0.2 MG/ML IJ SOLN
INTRAMUSCULAR | Status: AC
  Administered 2019-03-06: 02:00:00 0.2 mg
  Filled 2019-03-06: qty 1

## 2019-03-06 MED ORDER — ONDANSETRON HCL 4 MG PO TABS: 4.0000 mg | ORAL_TABLET | ORAL | Status: DC | PRN

## 2019-03-06 MED ORDER — COCONUT OIL OIL
1.0000 "application " | TOPICAL_OIL | Status: DC | PRN
  Administered 2019-03-06: 1 via TOPICAL
  Filled 2019-03-06: qty 120

## 2019-03-06 MED ORDER — PRENATAL MULTIVITAMIN CH
1.0000 | ORAL_TABLET | Freq: Every day | ORAL | Status: DC
  Administered 2019-03-06: 13:00:00 1 via ORAL
  Filled 2019-03-06: qty 1

## 2019-03-06 MED ORDER — DIBUCAINE (PERIANAL) 1 % EX OINT: 1.0000 "application " | TOPICAL_OINTMENT | CUTANEOUS | Status: DC | PRN

## 2019-03-06 NOTE — Discharge Summary (Signed)
OB Discharge Summary     Patient Name: Wendy Warren DOB: [birth year] MRN: [id number]  Date of admission: 03/05/2019 Delivering MD: Dr Yvette Rack Date of Delivery:03/06/2019 Date of discharge: 03/07/2019  Admitting diagnosis: [redacted] weeks gestation of pregnancy [Z3A.39] Intrauterine pregnancy: [redacted]w[redacted]d     Secondary diagnosis: Elective induction of labor     Discharge diagnosis: Term Pregnancy Delivered,                        Hospital course:  Induction of Labor With Vaginal Delivery   34 y.o. yo G3P2002 at [redacted]w[redacted]d was admitted to the hospital 03/05/2019 for induction of labor.  Indication for induction: Favorable cervix at term.  Patient had an uncomplicated labor course as follows: Membrane Rupture Time/Date: 5:42 PM ,03/05/2019   Intrapartum Procedures: Episiotomy: None [1]                                         Lacerations:  1st degree [2]  Patient had delivery of a Viable infant.  Information for the patient's newborn:  Rajni, [place] [[id number]]  Delivery Method: Vag-Spont    03/06/2019  Details of delivery can be found in separate delivery note.  Patient had a routine postpartum course. Patient is discharged home 03/07/19.                                                                 Post partum procedures:none  Complications: None  Physical exam on 03/07/2019: Vitals:   03/06/19 1220 03/06/19 1631 03/06/19 2324 03/07/19 0829  BP: 123/78 122/66 127/72 126/86  Pulse: 92 86 90 (!) 109  Resp: 20 18 18 20   Temp: 98.5 F (36.9 C) 98.6 F (37 C) 98.2 F (36.8 C) 98.9 F (37.2 C)  TempSrc: Oral Oral Oral Oral  SpO2: 99% 98% 99% 98%  Weight:      Height:       General: alert, awake, in NAD Lochia: appropriate Uterine Fundus: firm at U/ ML/ NT. Small umbilical hernia to left of umbilicus present Perineum: intact and healing well DVT Evaluation: No evidence of DVT seen on physical exam.  Labs: Lab Results  Component Value Date   WBC 17.4 (H) 03/06/2019   HGB 12.3  03/06/2019   HCT 36.5 03/06/2019   MCV 94.8 03/06/2019   PLT 203 03/06/2019   CMP Latest Ref Rng & Units 02/28/2019  Glucose 70 - 99 mg/dL 80  BUN 6 - 20 mg/dL 10  Creatinine 0.44 - 1.00 mg/dL 0.63  Sodium 135 - 145 mmol/L 133(L)  Potassium 3.5 - 5.1 mmol/L 4.0  Chloride 98 - 111 mmol/L 104  CO2 22 - 32 mmol/L 21(L)  Calcium 8.9 - 10.3 mg/dL 9.6  Total Protein 6.5 - 8.1 g/dL 5.8(L)  Total Bilirubin 0.3 - 1.2 mg/dL 0.6  Alkaline Phos 38 - 126 U/L 130(H)  AST 15 - 41 U/L 20  ALT 0 - 44 U/L 17    Discharge instruction: per After Visit Summary.  Medications:  Allergies as of 03/07/2019      Reactions   Morphine And Related Other (See Comments)   Palpitations, facial numbness  Medication List    TAKE these medications   acetaminophen 500 MG tablet Commonly known as: TYLENOL Take 2 tablets (1,000 mg total) by mouth every 6 (six) hours as needed.   amphetamine-dextroamphetamine 15 MG tablet Commonly known as: ADDERALL Take 15 mg by mouth daily.   ibuprofen 200 MG tablet Commonly known as: ADVIL Take 3 tablets (600 mg total) by mouth every 6 (six) hours as needed.   Prenatal Vitamin 27-0.8 MG Tabs Take 1 tablet by mouth daily.       Diet: routine diet  Activity: Advance as tolerated. Pelvic rest for 6 weeks.   Outpatient follow up: Follow-up Information    Schuman, Jaquelyn Bitter, MD. Schedule an appointment as soon as possible for a visit in 2 week(s).   Specialty: Obstetrics and Gynecology Contact information: [address] [place] [address] [phone]             Postpartum contraception: Lactational Amenorrhea Method Rhogam Given postpartum: no Rubella vaccine given postpartum: no Varicella vaccine given postpartum: no TDaP given antepartum or postpartum: Yes, AP  Newborn Data: Live born female Zoey Birth Weight: 7 lb 7.9 oz (3400 g) APGAR: 6, 9  Newborn Delivery   Birth date/time: 03/06/2019 01:42:00 Delivery type: Vaginal,  Spontaneous       Baby Feeding: Breast  Disposition:home with mother  SIGNED:  Farrel Conners, CNM 03/07/2019 11:15 AM

## 2019-03-06 NOTE — Lactation Note (Signed)
This note was copied from a baby's chart. Lactation Consultation Note  Patient Name: Wendy Warren [id number] Date: 03/06/2019 Reason for consult: Follow-up assessment  Baby fed at 2:30 for 20 minutes on the right breast per parents. [facility] student entered to baby asleep with Dad, and Mom in bed. Mom reports sore nipples, and has coconut oil in the room. [facility] student fetched cotton swabs and assisted Mom with application of coconut oil. [facility] student reviewed prevention of damage with a good latch and to remove baby and start again with any pinching pain. Mom was confident with knowing when the latch did not feel correct. Mom plans to combine breastfeeding and feeding expressed breast milk through a bottle. [employer] student explained pace bottle feeding and encouraged parents to review videos of pace bottle feeding at home. LC student encouraged Mom to rest while baby is asleep to be prepared for cluster feeding tonight.   Parents were encouraged to call out with any feeding questions.    Maternal Data Formula Feeding for Exclusion: No Does the patient have breastfeeding experience prior to this delivery?: Yes  Feeding Feeding Type: Breast Fed  LATCH Score                   Interventions Interventions: Breast feeding basics reviewed;Coconut oil  Lactation Tools Discussed/Used     Consult Status Consult Status: Follow-up Date: 03/06/19 Follow-up type: In-patient    Wendy Warren 03/06/2019, 3:50 PM

## 2019-03-06 NOTE — Discharge Instructions (Signed)
Discharge Instructions:   Follow-up Appointment: 2 weeks, please call the office to schedule  If there are any new medications, they have been ordered and will be available for pickup at the listed pharmacy on your way home from the hospital.   Call the office if you have any of the following: headache, visual changes, fever >101.0 F, chills, shortness of breath, breast concerns, excessive vaginal bleeding, incision drainage or problems, leg pain or redness, depression or any other concerns. If you have vaginal discharge with an odor, let your doctor know.   It is normal to bleed for up to 6 weeks. You should not soak through more than 1 pad in 1 hour. If you have a blood clot larger than your fist with continued bleeding, call your doctor.   Activity: Do not lift > 15 lbs for 6 weeks (do not lift anything heavier than your baby). No intercourse, tampons, swimming pools, hot tubs, baths (only showers) for 6 weeks.  No driving for 1-2 weeks. Do not drive while taking narcotic or opioid pain medication.  Continue taking your prenatal vitamin, especially if breastfeeding. Increase calories and fluids (water) while breastfeeding.   Your milk will come in, in the next couple of days (right now it is colostrum). You may have a slight fever when your milk comes in, but it should go away on its own.  If it does not, and rises above 101 F please call the doctor. You will also feel achy and your breasts will be firm. They will also start to leak. If you are breastfeeding, continue as you have been and you can pump/express milk for comfort.   If you have too much milk, your breasts can become engorged, which could lead to mastitis. This is an infection of the milk ducts. It can be very painful and you will need to notify your doctor to obtain a prescription for antibiotics. You can also treat it with a shower or hot/cold compress.   For concerns about your baby, please call your pediatrician.  For  breastfeeding concerns, the lactation consultant can be reached at [phone].   Postpartum blues (feelings of happy one minute and sad another minute) are normal for the first few weeks but if it gets worse let your doctor know.   Congratulations! We enjoyed caring for you and your new bundle of joy!   

## 2019-03-06 NOTE — Lactation Note (Signed)
This note was copied from a baby's chart. Lactation Consultation Note  Patient Name: Girl Palyn Scrima [id number] Date: 03/06/2019 Reason for consult: Initial assessment;Mother's request  LC Student entered the room as parent and nurse were attempting to latch baby. As nurse left baby had a large clear spitup with gagging and hiccups afterwards. [facility] student assisted in ensuring that all spitup was cleared, and baby was comfortable. Baby was not showing any hunger cues, and parents were concerned at the length of time since the last feed. [facility] student educated parents about typical newborn behavior, stomach size, and eventual cluster feeding. Parents were encouraged to keep baby skin to skin to encourage feeding and to watch for hunger cues.   Parents were encouraged to call out to lactation for any questions or if they need assistance.   [facility] student returned to write Lactation number on board, and witnessed another small emesis followed by some gagging and hiccups. Parent was taught hand expression to provide breast stimulation, and given a colostrum collecting tube to store what is able to be expressed or give to baby through finger feeds.      Maternal Data Formula Feeding for Exclusion: No Has patient been taught Hand Expression?: Yes  Feeding Feeding Type: Breast Fed  LATCH Score                   Interventions Interventions: Breast feeding basics reviewed;Skin to skin;Hand express  Lactation Tools Discussed/Used Tools: Other (comment)(Colostrum collecting Tube)   Consult Status Consult Status: Follow-up Date: 03/06/19 Follow-up type: In-patient    Willa Rough Sophie Tamez 03/06/2019, 11:42 AM

## 2019-03-06 NOTE — Plan of Care (Signed)
Vs stable except a little tachycardia; to mother/baby unit around 0500; has voided in birthplace after delivery; has had motrin and tylenol for pain control; breastfeeding and does need some assistance

## 2019-03-06 NOTE — Progress Notes (Signed)
Admit Date: 03/05/2019 Today's Date: 03/06/2019  Post Partum Day  0  Subjective:  no complaints, up ad lib, voiding and tolerating PO  Objective: Temp:  [97.8 F (36.6 C)-98.5 F (36.9 C)] 98.5 F (36.9 C) (02/09 1220) Pulse Rate:  [87-104] 92 (02/09 1220) Resp:  [17-20] 20 (02/09 1220) BP: (99-151)/(60-87) 123/78 (02/09 1220) SpO2:  [94 %-99 %] 99 % (02/09 1220)  Physical Exam:  General: alert, cooperative and no distress Lochia: appropriate Uterine Fundus: firm Incision: none DVT Evaluation: No evidence of DVT seen on physical exam.  Recent Labs    03/05/19 0927 03/06/19 0617  HGB 13.5 12.3  HCT 39.2 36.5    Assessment/Plan: Breastfeeding and Infant doing well  Monitor blood loss PNV   LOS: 1 day   Letitia Libra [facility] 03/06/2019, 2:42 PM

## 2019-03-07 ENCOUNTER — Encounter: Payer: Self-pay | Admitting: Obstetrics and Gynecology

## 2019-03-07 DIAGNOSIS — Z3490 Encounter for supervision of normal pregnancy, unspecified, unspecified trimester: Secondary | ICD-10-CM | POA: Diagnosis present

## 2019-03-07 MED ORDER — ACETAMINOPHEN 500 MG PO TABS: 1000.0000 mg | ORAL_TABLET | Freq: Four times a day (QID) | ORAL | 0 refills | Status: AC | PRN

## 2019-03-07 MED ORDER — IBUPROFEN 200 MG PO TABS: 600.0000 mg | ORAL_TABLET | Freq: Four times a day (QID) | ORAL | Status: AC | PRN

## 2019-03-07 NOTE — Progress Notes (Signed)
DC to home via WC with NB.  To car via Insurance underwriter.

## 2019-03-07 NOTE — Progress Notes (Signed)
DC inst given.  Pt verb u/o of f/u appt in 2 wks.  Reviewed s/s of when to notify provider.  Pt stated that she took Adderral during her pregnancy and will take it when at home.  Contacted LC to check about this medication. Medication OK to take during breastfeeding per Lorriane Shire.

## 2019-03-07 NOTE — Lactation Note (Signed)
This note was copied from a baby's chart. Lactation Consultation Note  Patient Name: Wendy Warren [id number] Date: 03/07/2019 Reason for consult: Follow-up assessment  LC student entered room at [phone] for follow-up assessment. Both mother and father of baby were present. Mother of baby was sitting up in the bed, and father of baby holding, with baby awake, but content and not showing hunger cues.   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 Infant has a gestational age of [redacted]w[redacted]d, and is currently 26 hours old. Infant was born via vaginal delivery. Output for the baby looks good. Baby is down -3% from original birth weight of 7lbs7.9oz. Mother of baby's feeding choice upon admission was breastmilk, and is currently still breastmilk.   [facility] student educated both parents about what to expect once discharged. [facility] student reviewed breastfeeding basics and expected output from baby in the coming days. [facility] student inquired about pumps at home, and went over the options for manual pumps to use as needed. [facility] student asked permission to do perform a breast exam on mother of baby, as she did report experiencing some tenderness. Upon assessment, LC student found a positional stripe on the left nipple, none on the right nipple. [facility] student educated mother of baby about the importance of sustaining a deep latch during feedings in order to prevent nipple trauma. Mother of baby reports that she has been using coconut oil for the tenderness and that it has been helpful.   [facility] student encouraged mother of baby to continue with the breastfeeding basics. [facility] student encouraged both parents to follow hunger cues and feed on demand. [facility] student gave outpatient lactation information and encouraged both parents to follow-up as needed for continued support.   Maternal Data Formula Feeding for Exclusion: No Has patient been taught Hand Expression?: Yes Does the patient have breastfeeding experience prior to this delivery?: No  Feeding    LATCH  Score       Type of Nipple: Everted at rest and after stimulation  Comfort (Breast/Nipple): Soft / non-tender  Hold (Positioning): No assistance needed to correctly position infant at breast.     Interventions Interventions: Breast feeding basics reviewed;Hand express;Coconut oil;Position options  Lactation Tools Discussed/Used Tools: Coconut oil   Consult Status Consult Status: Complete Date: 03/07/19 Follow-up type: Call as needed    Jimmye Norman 03/07/2019, 9:45 AM

## 2019-03-07 NOTE — Anesthesia Postprocedure Evaluation (Signed)
Anesthesia Post Note  Patient: Wendy Warren  Procedure(s) Performed: AN AD HOC LABOR EPIDURAL  Patient location during evaluation: Mother Baby Anesthesia Type: Epidural Level of consciousness: awake and alert Pain management: pain level controlled Vital Signs Assessment: post-procedure vital signs reviewed and stable Respiratory status: spontaneous breathing, nonlabored ventilation and respiratory function stable Cardiovascular status: stable Postop Assessment: no headache, no backache and epidural receding Anesthetic complications: no     Last Vitals:  Vitals:   03/06/19 1631 03/06/19 2324  BP: 122/66 127/72  Pulse: 86 90  Resp: 18 18  Temp: 37 C 36.8 C  SpO2: 98% 99%    Last Pain:  Vitals:   03/06/19 2324  TempSrc: Oral  PainSc:                  [name]

## 2019-03-08 LAB — SURGICAL PATHOLOGY

## 2019-03-21 ENCOUNTER — Other Ambulatory Visit: Payer: Self-pay

## 2019-03-21 ENCOUNTER — Encounter: Payer: Self-pay | Admitting: Obstetrics and Gynecology

## 2019-03-21 ENCOUNTER — Ambulatory Visit (INDEPENDENT_AMBULATORY_CARE_PROVIDER_SITE_OTHER): Payer: Self-pay | Admitting: Obstetrics and Gynecology

## 2019-03-21 VITALS — BP 110/68 | Ht 62.0 in | Wt 196.0 lb

## 2019-03-21 DIAGNOSIS — R112 Nausea with vomiting, unspecified: Secondary | ICD-10-CM

## 2019-03-21 MED ORDER — METOCLOPRAMIDE HCL 10 MG PO TABS: 10.0000 mg | ORAL_TABLET | Freq: Four times a day (QID) | ORAL | 3 refills | Status: AC | PRN

## 2019-03-21 NOTE — Patient Instructions (Signed)
INCREASING YOUR MILK SUPPLY  Mothers make more milk by removing milk from their breasts often. Removing milk frequently:  __ If you are breastfeeding your baby directly, breastfeed your baby at least every three hours during the day. __ Use your breast pump for 5 to 10 minutes after each feeding or pump approximately 30 to 60 minutes after you finish feeding (Choose the schedule that is more convenient for you and so you remove more milk with the pump.) __ If you are using a breast pump instead of breastfeeding directly, pump your breasts at least 8 times each 24 hours (every three hours).  A schedule may be helpful as you plan at least 8 feedings and pumping. 7:00 a.m. 10:00 a.m. 1:00 p.m. 4:00 p.m. 7:00 p.m. 10:00 p.m. 1:00 a.m.* 4:00 a.m.* 25% of your milk can be obtained during the night.  *It is probably better to not set an alarm clock for nighttime feedings/pumping. If you or your baby wakes during the night, it is a good idea to feed or pump. You can feed or pump more often during the day (any time your baby is interested in feeding) in order to have 8 feedings or pumping each 24 hours.  Removing as much milk as possible:  __ Massage your breasts before feeding or pumping to move your milk to your nipple. Roll your nipples between your finger and thumb before feeding or pumping. __ Massage your breasts while you are feeding or pumping to help your milk flow. __ Always encourage your baby to take as much milk as possible at each feeding. Don't stop your feedings just because a certain amount of time has passed.  Taking care of yourself:  __ Increase your rest for 3 to 5 days. Get other people to help with household chores. Sleep or rest when your baby sleeps. __ Eating nutritious foods helps you feel better. There are no foods that magically increase your milk supply. __ Drink plenty of fluids when you are thirsty. Don't "force" yourself to drink lots of water and fluids.  (Don't drink more than you want.)  Reglan (Metaclopromide):  The usual dosage of Reglan that is given to mothers to increase their milk supply is: 10-15mg  by mouth three times a day for two weeks.  Reglan is a prescription medication which has been given to women to increase their breast milk supply. It increases the prolactin hormone which increases milk production. Reglan is often given to treat gastric reflux, a stomach condition. It is given to premature or fullterm babies who have this condition and is considered safe for these babies. Sometimes Reglan is given for months.  Reglan is a medication which has few side effects. Some people find that it makes them sleepy. This can be helpful for new mothers who need to take naps since they are up at night with their babies. However, if you are very drowsy and sleepy when you drive a car, you need to have someone else drive, decrease your dosage of Reglan, or stop taking Reglan. If your milk supply does not increase with this dosage (after 1 to 2 weeks), ask your doctor if you can take 15mg  of Reglan three times each day.  After two weeks of taking Reglan, if your milk supply has increased enough to satisfy your baby, you can gradually reduce the amount that you take. If taking less Reglan causes you to make less milk, return to the dosage that you were taking. Some women need to maintain  a low dose of Reglan for a long time. Other mothers find that they can stop taking raglan and keep making enough milk.  Reglan helps about 80% of mothers make more milk. It is very important that you empty your breasts as completely as possible at least eight times each 24 hours.  Fenugreek (Trigonella foenum-graecum):  The usual dosage of Fenugreek that is recommended for mothers to increase their milk supply is: 3 capsules three times each day 3-4 cups of tea per day It usually takes 3 to 5 days to notice much difference in your milk  supply.  Fenugreek is an herb which is mostly used in the [place] to flavor artificial maple syrup. It has also been used to increase mothers' milk supply.  Fenugreek is not recommended if you are pregnant, if you have asthma, or if you have diabetes. Fenugreek is sold in health food stores in tea and capsule forms.  Mothers who take too much Fenugreek sometimes notice that their skin and urine smell like maple syrup. Some mothers have noticed loose bowel movements when they take a large amount of Fenugreek. Many mothers take Fenugreek for several months.

## 2019-03-21 NOTE — Progress Notes (Signed)
  OBSTETRICS POSTPARTUM CLINIC PROGRESS NOTE  Subjective:     Wendy Warren is a 34 y.o. G74P3003 female who presents for a postpartum visit. She is 2 weeks postpartum following a Term pregnancy and delivery by Vaginal, no problems at delivery.  I have fully reviewed the prenatal and intrapartum course. Anesthesia: epidural.  Postpartum course has been complicated by uncomplicated.  Baby is feeding by Bottle and Breast.  Bleeding: patient has not  resumed menses.  Bowel function is normal. Bladder function is normal.  Patient is not sexually active. Contraception method desired is rhythm method.  Postpartum depression screening: negative.  The following portions of the patient's history were reviewed and updated as appropriate: allergies, current medications, past family history, past medical history, past social history, past surgical history and problem list.  Review of Systems Pertinent items are noted in HPI.  Objective:    BP 110/68   Ht 5\' 2"  (1.575 m)   Wt 196 lb (88.9 kg)   LMP 06/02/2018 (Exact Date)   Breastfeeding Yes   BMI 35.85 kg/m   General:  alert and no distress   Breasts:  inspection negative, no nipple discharge or bleeding, no masses or nodularity palpable  Lungs: clear to auscultation bilaterally  Heart:  regular rate and rhythm, S1, S2 normal, no murmur, click, rub or gallop  Abdomen: soft, non-tender; bowel sounds normal; no masses,  no organomegaly.                              Assessment:  Post Partum Care visit 1. Low milk supply - metoCLOPramide (REGLAN) 10 MG tablet; Take 1 tablet (10 mg total) by mouth 4 (four) times daily as needed for nausea or vomiting.  Dispense: 120 tablet; Refill: 3  Discussed strategies to increase milk supply   Plan:  See orders and Patient Instructions Follow up in: 4 weeks or as needed.   08/02/2018 MD [facility], [facility] 03/21/2019 2:45 PM

## 2019-04-18 ENCOUNTER — Encounter: Payer: Self-pay | Admitting: Obstetrics and Gynecology

## 2019-04-18 ENCOUNTER — Ambulatory Visit (INDEPENDENT_AMBULATORY_CARE_PROVIDER_SITE_OTHER): Payer: Self-pay | Admitting: Obstetrics and Gynecology

## 2019-04-18 ENCOUNTER — Other Ambulatory Visit: Payer: Self-pay

## 2019-04-18 NOTE — Progress Notes (Signed)
  OBSTETRICS POSTPARTUM CLINIC PROGRESS NOTE  Subjective:     Wendy Warren is a 34 y.o. G4P3003 female who presents for a postpartum visit. She is 6 weeks postpartum following a Term pregnancy and delivery by Vaginal, no problems at delivery.  I have fully reviewed the prenatal and intrapartum course. Anesthesia: epidural.  Postpartum course has been complicated by uncomplicated.  Baby is feeding by Bottle.  Bleeding: patient has not  resumed menses.  Bowel function is normal. Bladder function is normal.  Patient is not sexually active. Contraception method desired is rhythm method.  Postpartum depression screening: negative. [place] 0.  The following portions of the patient's history were reviewed and updated as appropriate: allergies, current medications, past family history, past medical history, past social history, past surgical history and problem list.  Review of Systems Pertinent items are noted in HPI.  Objective:    BP 110/72   Ht 5\' 2"  (1.575 m)   Wt 199 lb (90.3 kg)   LMP 06/02/2018 (Exact Date)   Breastfeeding No   BMI 36.40 kg/m   General:  alert and no distress   Breasts:  inspection negative, no nipple discharge or bleeding, no masses or nodularity palpable  Lungs: clear to auscultation bilaterally  Heart:  regular rate and rhythm, S1, S2 normal, no murmur, click, rub or gallop  Abdomen: soft, non-tender; bowel sounds normal; no masses,  no organomegaly.      Vulva:  normal  Vagina: normal vagina, no discharge, exudate, lesion, or erythema  Cervix:  no cervical motion tenderness and no lesions  Corpus: normal size, contour, position, consistency, mobility, non-tender  Adnexa:  normal adnexa and no mass, fullness, tenderness  Rectal Exam: Not performed.          Assessment:  Post Partum Care visit There are no diagnoses linked to this encounter.  Plan:  See orders and Patient Instructions Follow up in: 12 months or as needed.   08/02/2018  MD [facility], [facility] 04/18/2019 2:09 PM

## 2019-04-18 NOTE — Patient Instructions (Signed)
Natural Family Planning  Natural Family Planning (NFP) is a type of birth control (contraception) in which no form of contraceptive medicine or device is used. The NFP method relies on knowing which days of the month a woman's ovary is producing an egg (ovulation). Ovulation is the time in the menstrual cycle when a woman is most fertile and, therefore, most likely to become pregnant. To lower the chance of pregnancy, sex is avoided during ovulation. NFP is a safe method of birth control and can prevent pregnancy if it is done correctly. However, NFP does not provide protection from sexually transmitted diseases. NFP can also be used as a method of getting pregnant, by deciding to have sex during ovulation. How does the NFP method work? NFP works by making both sexual partners aware of how the woman's body functions during her menstrual cycle.  Usually, a woman has a menstrual period every 28-30 days. However, there can be 23-35 days between each menstrual period. This varies for each woman. A woman with a 28-day menstrual cycle has about 6 days a month when she is most likely to get pregnant.  Ovulation happens 12-14 days before the start of the next menstrual period. There are different methods that are used to determine when ovulation starts.  An egg is fertile for 24 hours after it is released from the ovary. Sperm can live for 3 days or more. What NFP methods can be used to prevent pregnancy? The basal body temperature method During ovulation, there is often a slight increase in body temperature. To use this method:  Take your temperature every morning before getting out of bed. Write the temperature on a chart.  Do not have sex from the day the menstrual periods starts until 3 days after the increase in temperature. Note that body temperature may increase as a result of various factors, including fever, restless sleep, and working schedules. The cervical mucus method Right before  ovulation, mucus from the lower part of the uterus (cervix) changes from dry and sticky to wet and slippery. To use this method:  Check the mucus every day to look for these changes. Ovulation happens on the last day of wet, slippery mucus.  Do not have sex starting when you first see wet, slippery mucus and until 4 days after it stops or returns to its normal consistency.  With this method, it is safe to have sex: ? After the 4 days have passed, and until 10 days after the menstrual period starts. ? On days when mucus is dry. Note that cervical mucus can increase or change consistency due to reasons other than ovulation, such as infection, lubricants, some medicines, and sexual arousal. Other variations of the cervical mucus method include the TwoDay method, [place] ovulation method, and the [employer]. The symptothermal method This method combines the basal body temperature and the cervical mucus methods. The calendar method This method involves tracking menstrual cycles to determine when ovulation occurs. This method is helpful when the menstrual cycle varies in length. To use this method:  For 6 months, record when menstrual periods start and end and the length of each menstrual cycle. The length of a menstrual cycle is from day 1 of the present menstrual period to day 1 of the next menstrual period.  Use this information to determine when you will likely ovulate. Avoid sex during that time. You may need help from your health care provider to determine which days you are most likely to get pregnant. ?  Ovulation usually happens 12-14 days before the start of the next menstrual period. ? Light vaginal bleeding (spotting) or abdominal cramps during the middle of a menstrual cycle may be signs of ovulation. However, not all women have these symptoms. The standard days method This method is based on studies of women's hormone levels throughout normal menstrual cycles. Based on these  studies, a standard rule was developed to predict when women are most fertile during their menstrual cycle. According to the rule, if your cycle is 26-32 days long, you are most fertile between days 8 and 19. To prevent pregnancy, you should avoid having sex during this time, or use a barrier method of birth control. This method works best if your cycle is regularly between 26-32 days long. When should I not use the NFP method? You should not use NFP if:  You have very irregular menstrual periods or you sometimes skip a menstrual period.  You have abnormal vaginal bleeding.  You recently had a baby or are breastfeeding.  You have a vaginal or cervical infection.  You take medicines that can affect vaginal mucus or body temperature. These medicines include antibiotics, thyroid medicines, and antihistamines that are found in some cold and allergy medicines.  You absolutely do not want to become pregnant at the current time. Other methods of contraception are more effective at preventing pregnancy than NFP.  You are concerned about sexually transmitted diseases. Summary  Natural Family Planning methods help women and their partners to understand how to avoid pregnancy without using medicines or other methods.  A woman learns to recognize her most fertile days. A woman with a 28 day menstrual cycle has about 6 days per month when she can get pregnant. This information is not intended to replace advice given to you by your health care provider. Make sure you discuss any questions you have with your health care provider. Document Revised: 05/05/2018 Document Reviewed: 03/01/2016 Elsevier Patient Education  2020 Elsevier Inc.  

## 2021-06-23 IMAGING — US US MFM OB DETAIL+14 WK
2 series · 12 of 28 positions shown · non-contrast
Comparison: none

PATIENT INFO:

PERFORMED BY:
                   Sonographer                              VILL
SERVICE(S) PROVIDED:
 ----------------------------------------------------------------------
INDICATIONS:
  21 weeks gestation of pregnancy
  Anatomy
  Obesity (BMI 33)
FETAL EVALUATION:
 Num Of Fetuses:         1
 Fetal Heart Rate(bpm):  153
 Presentation:           Transverse, head to maternal left
 Placenta:               Anterior, No previa
 P. Cord Insertion:      Normal
                             Largest Pocket(cm)
BIOMETRY:
 BPD:      49.5  mm     G. Age:  21w 0d         30  %    CI:        70.34   %    70 - 86
                                                         FL/HC:      18.8   %    15.9 -
 HC:      188.2  mm     G. Age:  21w 1d         27  %
                                                         FL/BPD:     71.3   %
 FL:       35.3  mm     G. Age:  21w 1d         31  %
 HUM:      33.1  mm     G. Age:  21w 1d         41  %
 CER:      20.8  mm     G. Age:  19w 6d         12  %
GESTATIONAL AGE:
 LMP:           21w 3d        Date:  06/02/18                 EDD:   03/09/19
 U/S Today:     21w 1d                                        EDD:   03/11/19
 Best:          21w 3d     Det. By:  LMP  (06/02/18)          EDD:   03/09/19
ANATOMY:
 Cavum:                 CSP visualized         Aortic Arch:            Normal appearance
 Ventricles:            Normal appearance      Ductal Arch:            Normal appearance
 Choroid Plexus:        Within Normal Limits   Diaphragm:              Within Normal Limits
 Cerebellum:            Within Normal Limits   Stomach:                Seen
 Posterior Fossa:       Within Normal Limits   Abdomen:                Within Normal
                                                                       Limits
 Nuchal Fold:           Within Normal Limits   Abdominal Wall:         Normal appearance
 Face:                  Orbits visualized      Cord Vessels:           3 vessels
 Lips:                  Normal appearance      Kidneys:                Normal appearance
 Thoracic:              Within Normal Limits   Bladder:                Seen
 Heart:                 4-Chamber view         Spine:                  Spine
                        appears normal                                 down/Technically
                                                                       limited
 RVOT:                  Normal appearance      Upper Extremities:      Visualized
 LVOT:                  Normal appearance      Lower Extremities:      Visualized
 Other:  Ao/Pa, 3 vessel trachea, bicaval view and profile were normal
CERVIX UTERUS ADNEXA:
 Cervix
 Length:            4.8  cm.

[Series 1: us mfm ob detail+14 wk · 0.25mm/px · 10 of 76 slices shown (1 of 2)]
[im 4/76]
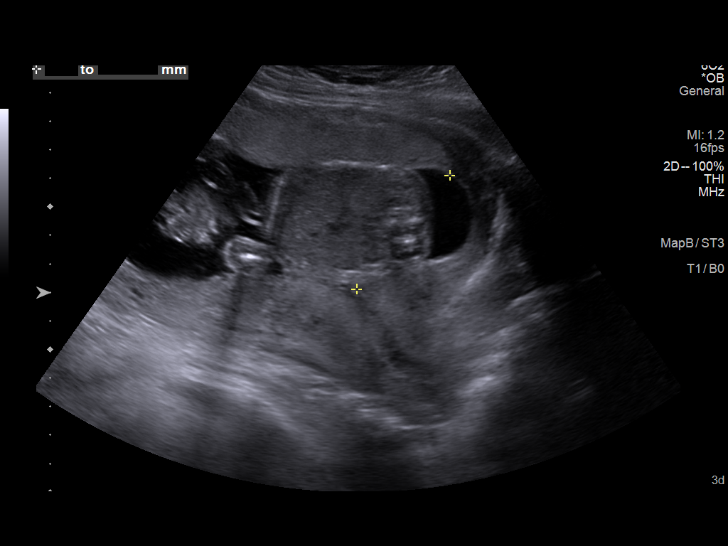
[im 10/76]
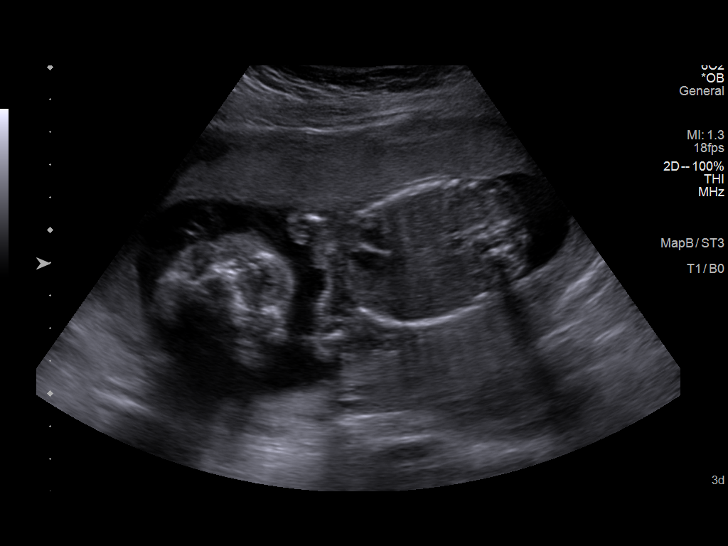
[im 17/76]
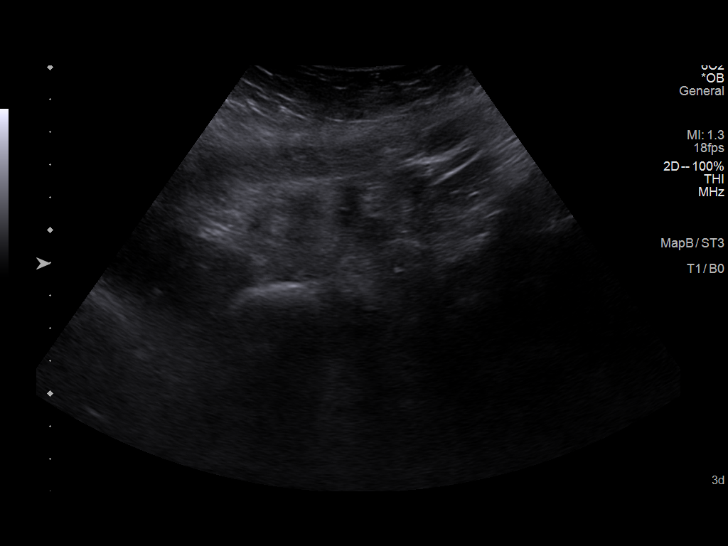
[im 27/76]
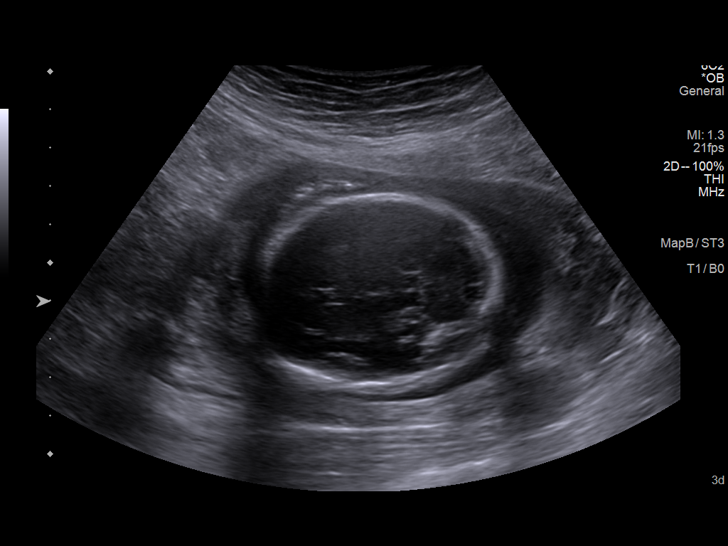
[im 33/76]
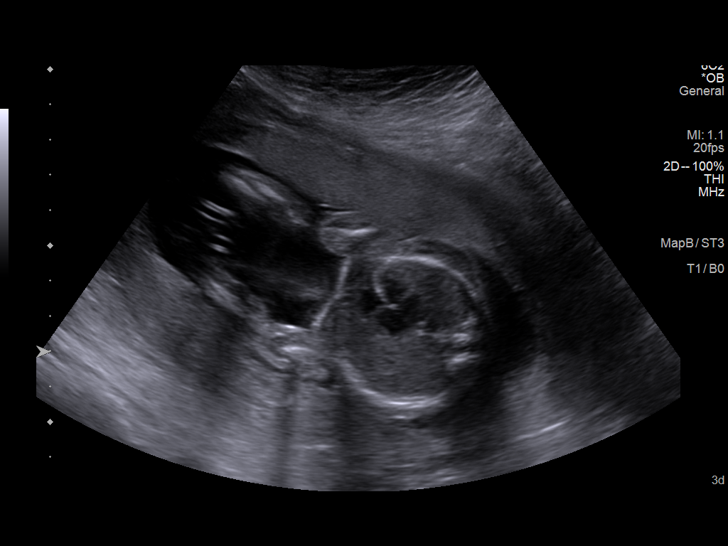
[im 40/76]
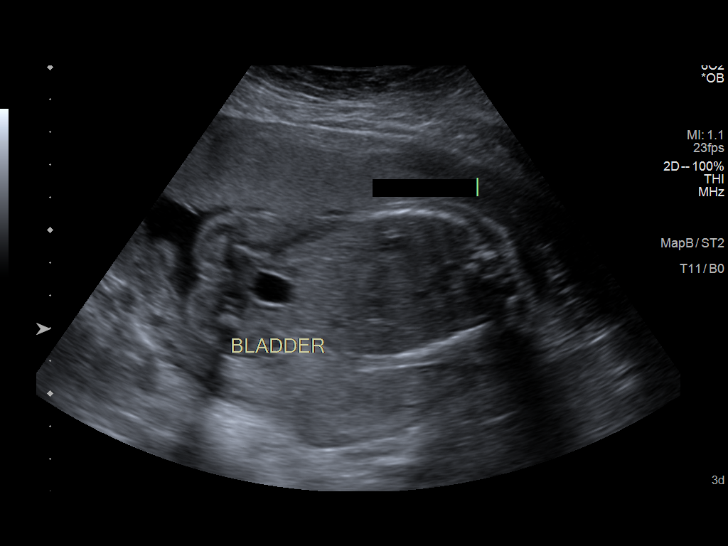
[im 49/76]
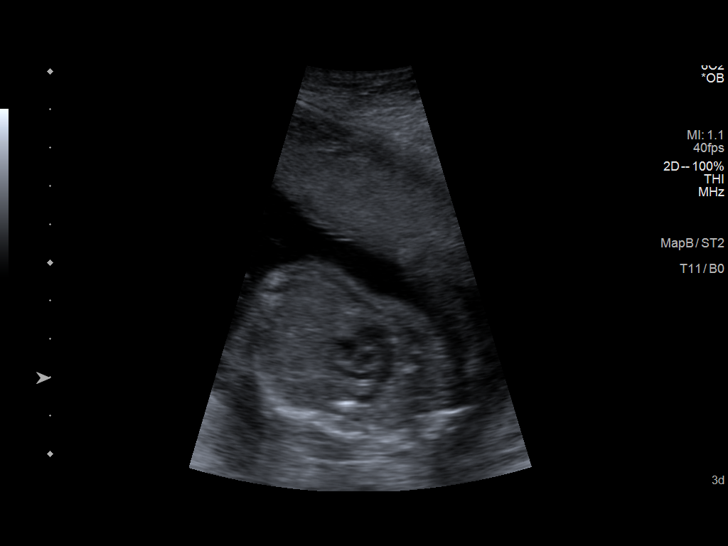
[im 56/76]
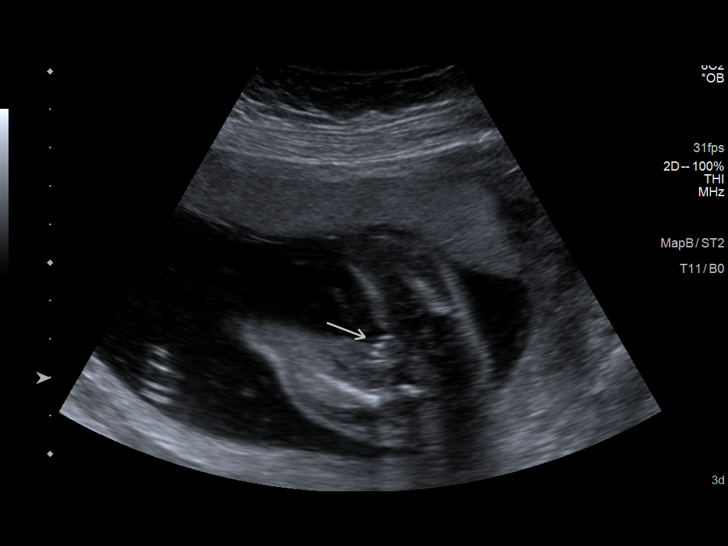
[im 62/76]
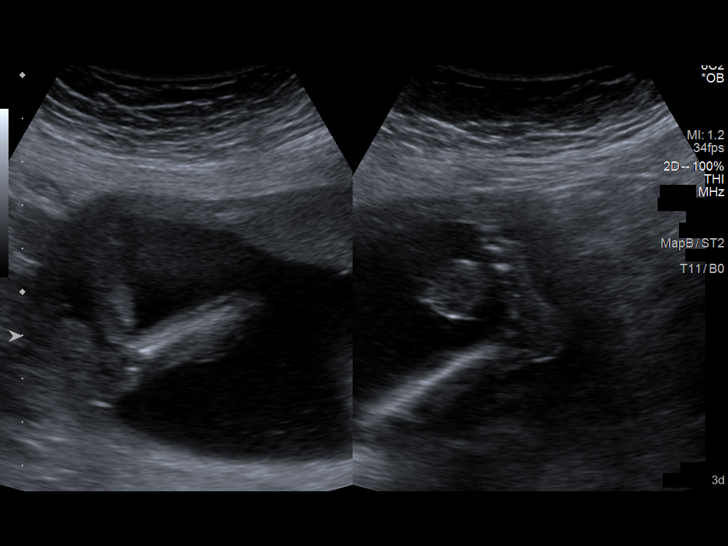
[im 72/76]
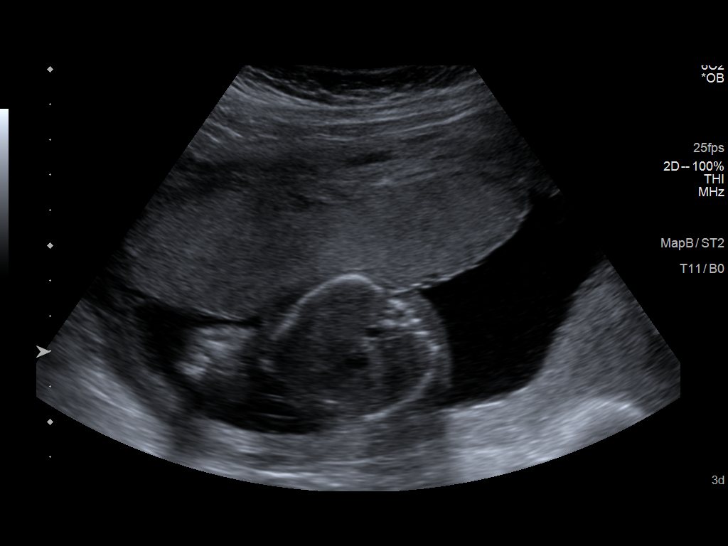

[Series 1001: us mfm ob detail+14 wk · 0.13mm/px · 2 of 12 slices shown (2 of 2)]
[im 1/12]
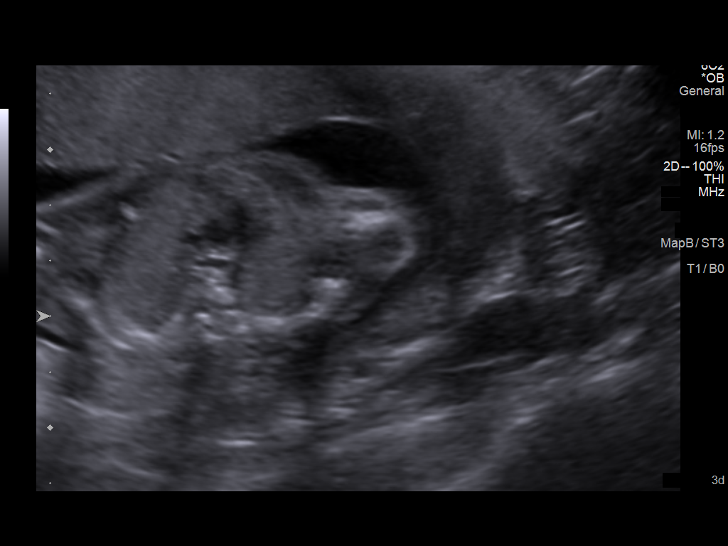
[im 8/12]
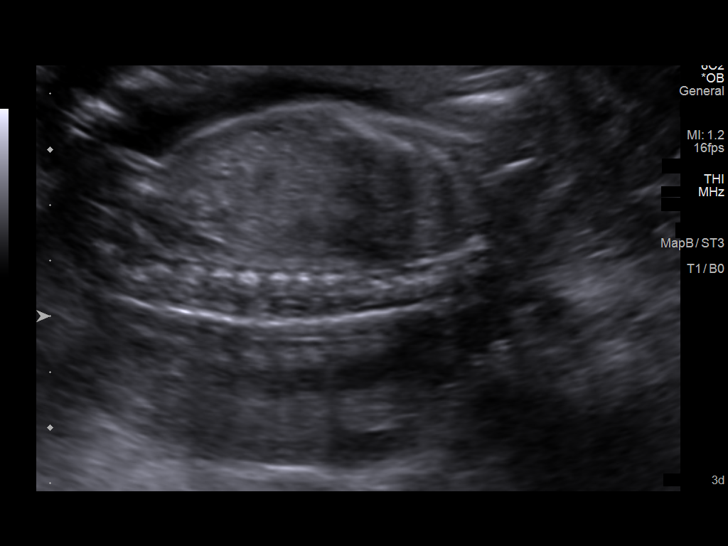

[12 of 28 positions shown; findings below may reference images not displayed]

IMPRESSION: Intrauterine pregnancy with a best estimated gestational age
 of 21 weeks 3 days.  Dating is based on LMP consistent with
 earliest available ultrasound performed at [facility]
 [place] on 09/07/18; measurements were reported as 13
 weeks 6 days.

 Detailed ultrasound performed for the indication of obesity.

 Due to fetal position, views of the sagittal spine were
 suboptimal but appeared grossly normal.  Other fetal
 anatomy views appear normal.

 Appropriate interval growth.

 Findings were discussed.  Of note, Ms. David Jill had negative
 cell free fetal DNA screen results; consider msAFP only.
                 [place], [place]

## 2022-09-24 ENCOUNTER — Other Ambulatory Visit: Payer: Self-pay

## 2022-09-24 ENCOUNTER — Emergency Department
Admission: EM | Admit: 2022-09-24 | Discharge: 2022-09-24 | Discharge status: Against Medical Advice | Payer: MEDICAID | Attending: Emergency Medicine | Admitting: Emergency Medicine

## 2022-09-24 ENCOUNTER — Emergency Department: Payer: MEDICAID

## 2022-09-24 DIAGNOSIS — F410 Panic disorder [episodic paroxysmal anxiety] without agoraphobia: Secondary | ICD-10-CM | POA: Insufficient documentation

## 2022-09-24 DIAGNOSIS — R0789 Other chest pain: Secondary | ICD-10-CM | POA: Insufficient documentation

## 2022-09-24 DIAGNOSIS — Z5321 Procedure and treatment not carried out due to patient leaving prior to being seen by health care provider: Secondary | ICD-10-CM | POA: Insufficient documentation

## 2022-09-24 LAB — BASIC METABOLIC PANEL
Anion gap: 14 (ref 5–15)
BUN: 10 mg/dL (ref 6–20)
CO2: 18 mmol/L — ABNORMAL LOW (ref 22–32)
Calcium: 9.6 mg/dL (ref 8.9–10.3)
Chloride: 103 mmol/L (ref 98–111)
Creatinine, Ser: 0.83 mg/dL (ref 0.44–1.00)
GFR, Estimated: >60 mL/min (ref >60)
Glucose, Bld: 105 mg/dL — ABNORMAL HIGH (ref 70–99)
Potassium: 2.9 mmol/L — ABNORMAL LOW (ref 3.5–5.1)
Sodium: 135 mmol/L (ref 135–145)

## 2022-09-24 LAB — CBC
HCT: 45.2 % (ref 36.0–46.0)
Hemoglobin: 15.9 g/dL — ABNORMAL HIGH (ref 12.0–15.0)
MCH: 31.5 pg (ref 26.0–34.0)
MCHC: 35.2 g/dL (ref 30.0–36.0)
MCV: 89.7 fL (ref 80.0–100.0)
Platelets: 351 10*3/uL (ref 150–400)
RBC: 5.04 MIL/uL (ref 3.87–5.11)
RDW: 12 % (ref 11.5–15.5)
WBC: 8.6 10*3/uL (ref 4.0–10.5)
nRBC: 0 % (ref 0.0–0.2)

## 2022-09-24 LAB — TROPONIN I (HIGH SENSITIVITY): Troponin I (High Sensitivity): 3 ng/L (ref <18)

## 2022-09-24 NOTE — ED Notes (Signed)
Called for room, no answer

## 2022-09-24 NOTE — ED Notes (Signed)
First nurse note: pt to ed via acems c/o anxiety attack. Hx of anxiety  120HR 100% RA 133/88

## 2022-09-24 NOTE — ED Notes (Signed)
Called for room  No answer  

## 2022-09-24 NOTE — ED Triage Notes (Signed)
Pt reports she was at work when she started to feel shaky, reports she was pale. Pt is hyperventilating in triage room RN encouraged pt to take slow deep breaths. Pt reports she has chest tightness, denies any other symptoms.

## 2022-09-24 NOTE — ED Notes (Signed)
Called multiple times for room. No answer
